# Patient Record
Sex: Male | Born: 1950
Health system: Southern US, Community
[De-identification: ages and names within clinical notes are randomized; demographics above are authoritative.]

## PROBLEM LIST (undated history)

## (undated) DIAGNOSIS — Z8669 Personal history of other diseases of the nervous system and sense organs: Secondary | ICD-10-CM

## (undated) DIAGNOSIS — E65 Localized adiposity: Secondary | ICD-10-CM

## (undated) DIAGNOSIS — M545 Low back pain, unspecified: Secondary | ICD-10-CM

## (undated) DIAGNOSIS — K219 Gastro-esophageal reflux disease without esophagitis: Secondary | ICD-10-CM

## (undated) DIAGNOSIS — M1990 Unspecified osteoarthritis, unspecified site: Secondary | ICD-10-CM

## (undated) DIAGNOSIS — Z9889 Other specified postprocedural states: Secondary | ICD-10-CM

## (undated) DIAGNOSIS — J189 Pneumonia, unspecified organism: Secondary | ICD-10-CM

## (undated) DIAGNOSIS — G4733 Obstructive sleep apnea (adult) (pediatric): Secondary | ICD-10-CM

## (undated) DIAGNOSIS — I10 Essential (primary) hypertension: Secondary | ICD-10-CM

## (undated) DIAGNOSIS — R112 Nausea with vomiting, unspecified: Secondary | ICD-10-CM

## (undated) DIAGNOSIS — Z8719 Personal history of other diseases of the digestive system: Secondary | ICD-10-CM

## (undated) HISTORY — PX: CARPAL TUNNEL RELEASE: SHX101

## (undated) HISTORY — DX: Essential (primary) hypertension: I10

## (undated) HISTORY — PX: APPENDECTOMY: SHX54

---
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
## 2005-01-12 ENCOUNTER — Ambulatory Visit (HOSPITAL_COMMUNITY): Admission: RE | Admit: 2005-01-12 | Discharge: 2005-01-12 | Payer: Self-pay | Admitting: Gastroenterology

## 2008-04-04 ENCOUNTER — Emergency Department (HOSPITAL_COMMUNITY): Admission: EM | Admit: 2008-04-04 | Discharge: 2008-04-04 | Payer: Self-pay | Admitting: Family Medicine

## 2009-06-27 ENCOUNTER — Emergency Department (HOSPITAL_COMMUNITY): Admission: EM | Admit: 2009-06-27 | Discharge: 2009-06-27 | Payer: Self-pay | Admitting: Emergency Medicine

## 2010-07-03 NOTE — Op Note (Signed)
NAME:  Joshua Cochran, TONY NO.:  [id number]   MEDICAL RECORD NO.:  [id number]          PATIENT TYPE:  AMB   LOCATION:  ENDO                         FACILITY:  [facility]   PHYSICIAN:  Graylin Shiver, M.D.   DATE OF BIRTH:  [birth year]   DATE OF PROCEDURE:  01/12/2005  DATE OF DISCHARGE:                                 OPERATIVE REPORT   PROCEDURE:  Colonoscopy.   INDICATIONS FOR PROCEDURE:  Heme-positive stool.   Informed consent was obtained after explanation of the risks of bleeding,  infection and perforation.   PREMEDICATION:  Fentanyl 75 mcg IV, Versed 7 mg IV.   DESCRIPTION OF PROCEDURE:  With the patient in the left lateral decubitus  position, a rectal exam was performed, no masses were felt. The Olympus  colonoscope was inserted into the rectum and advanced around the colon to  the cecum. Cecal landmarks were identified. The cecum and ascending colon  were normal. The transverse colon was normal. The descending colon, sigmoid  and rectum normal. The scope was retroflexed in the rectum. This looked  normal. The scope was straightened and brought out. He tolerated the  procedure well without complications.   IMPRESSION:  Normal colonoscopy to the cecum.   I would recommend a follow-up screening colonoscopy again in 10 years.           ______________________________  Graylin Shiver, M.D.     SFG/MEDQ  D:  01/12/2005  T:  01/13/2005  Job:  [id number]   cc:   L. Lupe Carney, M.D.  Fax: [phone]

## 2015-03-10 DIAGNOSIS — E291 Testicular hypofunction: Secondary | ICD-10-CM | POA: Diagnosis not present

## 2015-03-10 DIAGNOSIS — Z6841 Body Mass Index (BMI) 40.0 and over, adult: Secondary | ICD-10-CM | POA: Diagnosis not present

## 2015-03-10 DIAGNOSIS — R0683 Snoring: Secondary | ICD-10-CM | POA: Diagnosis not present

## 2015-03-10 DIAGNOSIS — E559 Vitamin D deficiency, unspecified: Secondary | ICD-10-CM | POA: Diagnosis not present

## 2015-03-31 DIAGNOSIS — G4733 Obstructive sleep apnea (adult) (pediatric): Secondary | ICD-10-CM | POA: Diagnosis not present

## 2015-04-21 DIAGNOSIS — Z6841 Body Mass Index (BMI) 40.0 and over, adult: Secondary | ICD-10-CM | POA: Diagnosis not present

## 2015-04-21 DIAGNOSIS — G4733 Obstructive sleep apnea (adult) (pediatric): Secondary | ICD-10-CM | POA: Diagnosis not present

## 2015-04-21 DIAGNOSIS — Z79899 Other long term (current) drug therapy: Secondary | ICD-10-CM | POA: Diagnosis not present

## 2015-04-21 DIAGNOSIS — E6609 Other obesity due to excess calories: Secondary | ICD-10-CM | POA: Diagnosis not present

## 2015-10-13 DIAGNOSIS — I10 Essential (primary) hypertension: Secondary | ICD-10-CM | POA: Diagnosis not present

## 2015-10-13 DIAGNOSIS — Z6841 Body Mass Index (BMI) 40.0 and over, adult: Secondary | ICD-10-CM | POA: Diagnosis not present

## 2015-10-13 DIAGNOSIS — G4733 Obstructive sleep apnea (adult) (pediatric): Secondary | ICD-10-CM | POA: Diagnosis not present

## 2015-10-13 DIAGNOSIS — E6609 Other obesity due to excess calories: Secondary | ICD-10-CM | POA: Diagnosis not present

## 2015-10-13 MED FILL — ANUCORT-HC 25 MG SUPP: 25 | 30 days supply | Qty: 20 | Fill #0

## 2015-10-13 MED FILL — HYDROCHLOROTHIAZIDE 25 MG T: 25 | 90 days supply | Qty: 90 | Fill #0

## 2016-02-16 DIAGNOSIS — Z8719 Personal history of other diseases of the digestive system: Secondary | ICD-10-CM

## 2016-02-16 HISTORY — DX: Personal history of other diseases of the digestive system: Z87.19

## 2016-04-05 DIAGNOSIS — G4733 Obstructive sleep apnea (adult) (pediatric): Secondary | ICD-10-CM | POA: Diagnosis not present

## 2016-04-05 DIAGNOSIS — K219 Gastro-esophageal reflux disease without esophagitis: Secondary | ICD-10-CM | POA: Diagnosis not present

## 2016-04-05 DIAGNOSIS — K210 Gastro-esophageal reflux disease with esophagitis: Secondary | ICD-10-CM | POA: Diagnosis not present

## 2016-04-05 DIAGNOSIS — I10 Essential (primary) hypertension: Secondary | ICD-10-CM | POA: Diagnosis not present

## 2016-04-05 DIAGNOSIS — E6609 Other obesity due to excess calories: Secondary | ICD-10-CM | POA: Diagnosis not present

## 2016-04-05 DIAGNOSIS — Z6841 Body Mass Index (BMI) 40.0 and over, adult: Secondary | ICD-10-CM | POA: Diagnosis not present

## 2016-04-05 MED FILL — raNITIdine HCL 300 MG TABS: 300 | 90 days supply | Qty: 90 | Fill #0

## 2016-04-05 MED FILL — BENAZEPRIL HCL 20 MG TABLET: 20 | 90 days supply | Qty: 90 | Fill #0

## 2016-04-12 ENCOUNTER — Encounter (INDEPENDENT_AMBULATORY_CARE_PROVIDER_SITE_OTHER): Payer: Self-pay | Admitting: Internal Medicine

## 2016-04-12 ENCOUNTER — Encounter (INDEPENDENT_AMBULATORY_CARE_PROVIDER_SITE_OTHER): Payer: Self-pay

## 2016-04-22 ENCOUNTER — Ambulatory Visit (INDEPENDENT_AMBULATORY_CARE_PROVIDER_SITE_OTHER): Payer: Self-pay | Admitting: Internal Medicine

## 2016-05-10 ENCOUNTER — Ambulatory Visit (INDEPENDENT_AMBULATORY_CARE_PROVIDER_SITE_OTHER): Payer: 59 | Admitting: Internal Medicine

## 2016-05-10 ENCOUNTER — Other Ambulatory Visit (INDEPENDENT_AMBULATORY_CARE_PROVIDER_SITE_OTHER): Payer: Self-pay | Admitting: Internal Medicine

## 2016-05-10 ENCOUNTER — Encounter (INDEPENDENT_AMBULATORY_CARE_PROVIDER_SITE_OTHER): Payer: Self-pay | Admitting: *Deleted

## 2016-05-10 ENCOUNTER — Encounter (INDEPENDENT_AMBULATORY_CARE_PROVIDER_SITE_OTHER): Payer: Self-pay | Admitting: Internal Medicine

## 2016-05-10 VITALS — BP 130/72 | HR 64 | Temp 97.7°F | Ht 74.0 in | Wt 306.8 lb

## 2016-05-10 DIAGNOSIS — K219 Gastro-esophageal reflux disease without esophagitis: Secondary | ICD-10-CM | POA: Diagnosis not present

## 2016-05-10 DIAGNOSIS — I10 Essential (primary) hypertension: Secondary | ICD-10-CM

## 2016-05-10 HISTORY — DX: Essential (primary) hypertension: I10

## 2016-05-10 MED ORDER — PANTOPRAZOLE SODIUM 40 MG PO TBEC: 40.0000 mg | DELAYED_RELEASE_TABLET | Freq: Two times a day (BID) | ORAL | 5 refills | Status: DC

## 2016-05-10 MED FILL — PANTOPRAZOLE SOD DR 40 MG T: 40 | 30 days supply | Qty: 60 | Fill #0

## 2016-05-10 NOTE — Progress Notes (Signed)
   Subjective:    Patient ID: Joshua Cochran, male    DOB: 03/27/1950, 66 y.o.   MRN: [id number]  HPI Referred by Dr. Olena LeatherwoodHasanaj for GERD. He states he has acid reflux x 6-7 yrs. He has taken oc TUMs in the past. Some days are worse than others. He was started on Zantac about 6 weeks ago which has helped.  He says he 50% better. 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 Brett Albinoizza causes him problems.  He tries to watch what he eats.  He eats plenty of fruits.    Sometimes he has a bad taste in his mouth in the morning. He wears a C-Pap. Appetite is good. No weight loss. He has a BM daily. [place] colonoscopy 1 yrs ago in [place] and he reports it was normal.  Patient would like to proceed with an EGD Wife is a Engineer, civil (consulting)nurse.    Review of Systems Past Medical History:  Diagnosis Date  . Essential hypertension, benign 05/10/2016    No past surgical history on file.  No Known Allergies  No current outpatient prescriptions on file prior to visit.   No current facility-administered medications on file prior to visit.    Current Outpatient Prescriptions  Medication Sig Dispense Refill  . benazepril (LOTENSIN) 20 MG tablet Take 20 mg by mouth daily.    . Omega-3 Fatty Acids (FISH OIL) 1000 MG CAPS Take 3,000 mg by mouth.    . ranitidine (ZANTAC) 300 MG tablet Take 300 mg by mouth at bedtime.    . cholecalciferol (VITAMIN D) 1000 units tablet Take 1,000 Units by mouth daily.     No current facility-administered medications for this visit.        Objective:   Physical Exam Blood pressure 130/72, pulse 64, temperature 97.7 F (36.5 C), height 6\' 2"  (1.88 m), weight (!) 306 lb 12.8 oz (139.2 kg).  Alert and oriented. Skin warm and dry. Oral mucosa is moist.   . Sclera anicteric, conjunctivae is pink. Thyroid not enlarged. No cervical lymphadenopathy. Lungs clear. Heart regular rate and rhythm.  Abdomen is soft. Bowel sounds are positive. No hepatomegaly. No abdominal masses felt.  No tenderness.  No edema to lower extremities.         Assessment & Plan:  GERD.  Am going to switch him to Protonix twice a day. Needs an EGD to r/u PUD. GERD diet given to patient.

## 2016-05-10 NOTE — Patient Instructions (Signed)
EGD. The risks and benefits such as perforation, bleeding, and infection were reviewed with the patient and is agreeable. 

## 2016-07-06 ENCOUNTER — Other Ambulatory Visit (INDEPENDENT_AMBULATORY_CARE_PROVIDER_SITE_OTHER): Payer: Self-pay | Admitting: Internal Medicine

## 2016-07-06 DIAGNOSIS — K219 Gastro-esophageal reflux disease without esophagitis: Secondary | ICD-10-CM

## 2016-07-08 ENCOUNTER — Ambulatory Visit (HOSPITAL_COMMUNITY)
Admission: RE | Admit: 2016-07-08 | Discharge: 2016-07-08 | Disposition: A | Discharge status: Home / Self Care | Payer: 59 | Source: Ambulatory Visit | Attending: Internal Medicine | Admitting: Internal Medicine

## 2016-07-08 ENCOUNTER — Encounter (HOSPITAL_COMMUNITY)
Admission: RE | Disposition: A | Discharge status: Home / Self Care | Payer: Self-pay | Source: Ambulatory Visit | Attending: Internal Medicine

## 2016-07-08 ENCOUNTER — Encounter (HOSPITAL_COMMUNITY): Payer: Self-pay

## 2016-07-08 DIAGNOSIS — K228 Other specified diseases of esophagus: Secondary | ICD-10-CM

## 2016-07-08 DIAGNOSIS — K449 Diaphragmatic hernia without obstruction or gangrene: Secondary | ICD-10-CM | POA: Insufficient documentation

## 2016-07-08 DIAGNOSIS — K219 Gastro-esophageal reflux disease without esophagitis: Secondary | ICD-10-CM | POA: Diagnosis not present

## 2016-07-08 DIAGNOSIS — Z6839 Body mass index (BMI) 39.0-39.9, adult: Secondary | ICD-10-CM | POA: Diagnosis not present

## 2016-07-08 DIAGNOSIS — E669 Obesity, unspecified: Secondary | ICD-10-CM | POA: Insufficient documentation

## 2016-07-08 DIAGNOSIS — I10 Essential (primary) hypertension: Secondary | ICD-10-CM | POA: Insufficient documentation

## 2016-07-08 DIAGNOSIS — K210 Gastro-esophageal reflux disease with esophagitis: Secondary | ICD-10-CM

## 2016-07-08 DIAGNOSIS — Z79899 Other long term (current) drug therapy: Secondary | ICD-10-CM | POA: Insufficient documentation

## 2016-07-08 HISTORY — PX: ESOPHAGOGASTRODUODENOSCOPY: SHX5428

## 2016-07-08 SURGERY — EGD (ESOPHAGOGASTRODUODENOSCOPY)
Anesthesia: Moderate Sedation

## 2016-07-08 MED ORDER — SODIUM CHLORIDE 0.9 % IV SOLN
INTRAVENOUS | Status: DC
  Administered 2016-07-08: 12:00:00 via INTRAVENOUS

## 2016-07-08 MED ORDER — MIDAZOLAM HCL 5 MG/5ML IJ SOLN
INTRAMUSCULAR | Status: AC
  Filled 2016-07-08: qty 10

## 2016-07-08 MED ORDER — MEPERIDINE HCL 50 MG/ML IJ SOLN
INTRAMUSCULAR | Status: DC | PRN
  Administered 2016-07-08 (×2): 25 mg via INTRAVENOUS

## 2016-07-08 MED ORDER — STERILE WATER FOR IRRIGATION IR SOLN
Status: DC | PRN
  Administered 2016-07-08: 2.5 mL

## 2016-07-08 MED ORDER — MIDAZOLAM HCL 5 MG/5ML IJ SOLN
INTRAMUSCULAR | Status: DC | PRN
  Administered 2016-07-08 (×3): 2 mg via INTRAVENOUS

## 2016-07-08 MED ORDER — LIDOCAINE VISCOUS 2 % MT SOLN
OROMUCOSAL | Status: AC
  Filled 2016-07-08: qty 15

## 2016-07-08 MED ORDER — MEPERIDINE HCL 50 MG/ML IJ SOLN
INTRAMUSCULAR | Status: AC
  Filled 2016-07-08: qty 1

## 2016-07-08 NOTE — H&P (Signed)
Joshua Cochran is an 66 y.o. male.   Chief Complaint: Patient is here for EGD. HPI: Patient is 66 year old Caucasian male who has symptoms of GERD for more than 6 years. He did not do well with OTC medications. He is doing much better with double dose PPI. He is here for diagnostic EGD. He denies nausea vomiting. He is not having dysphagia anymore. He also denies abdominal pain or melena. Patient does not smoke cigarettes or drink alcohol. 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  Past Medical History:  Diagnosis Date  . Essential hypertension, benign 05/10/2016        Sleep apnea       Obesity.       Chronic GERD.  Past Surgical History:  Procedure Laterality Date  . APPENDECTOMY     age 89, ruptured  . CARPAL TUNNEL RELEASE     both wrist in 1994    History reviewed. No pertinent family history. Social History:  reports that he has never smoked. He has never used smokeless tobacco. He reports that he uses drugs, including Methylphenidate. He reports that he does not drink alcohol.  Allergies: No Known Allergies  Medications Prior to Admission  Medication Sig Dispense Refill  . benazepril (LOTENSIN) 20 MG tablet Take 20 mg by mouth daily.    . cholecalciferol (VITAMIN D) 1000 units tablet Take 1,000 Units by mouth daily.    Marland Kitchen. ibuprofen (ADVIL,MOTRIN) 200 MG tablet Take 400 mg by mouth every 8 (eight) hours as needed (for pain.).    Marland Kitchen. naproxen sodium (ANAPROX) 220 MG tablet Take 440 mg by mouth 2 (two) times daily as needed (for pain).    . Omega-3 Fatty Acids (FISH OIL) 1000 MG CAPS Take 3,000 mg by mouth daily at 12 noon.     . pantoprazole (PROTONIX) 40 MG tablet Take 1 tablet (40 mg total) by mouth 2 (two) times daily before a meal. 60 tablet 5    No results found for this or any previous visit (from the past 48 hour(s)). No results found.  ROS  Blood pressure (!) 158/72, pulse 81, temperature 98.1 F (36.7 C), temperature source Oral,  resp. rate 13, height 6' (1.829 m), weight 290 lb (131.5 kg), SpO2 100 %. Physical Exam  Constitutional:  Obese Caucasian male in NAD.  HENT:  Mouth/Throat: Oropharynx is clear and moist.  Eyes: Conjunctivae are normal. No scleral icterus.  Neck: No thyromegaly present.  Cardiovascular: Normal rate, regular rhythm and normal heart sounds.   No murmur heard. Respiratory: Effort normal and breath sounds normal.  GI:  Abdomen is obese. Right lower paramedian scar. Abdomen is soft and nontender without organomegaly or masses.  Musculoskeletal: He exhibits no edema.  Lymphadenopathy:    He has no cervical adenopathy.  Neurological: He is alert.  Skin: Skin is warm and dry.     Assessment/Plan Chronic GERD. Diagnostic EGD.  Lionel DecemberNajeeb Rehman, MD 07/08/2016, 1:35 PM

## 2016-07-08 NOTE — Op Note (Signed)
[facility] Patient Name: Joshua Cochran Procedure Date: 07/08/2016 12:27 PM MRN: [id number] Date of Birth: [birth year] Attending MD: Lionel December , MD CSN: [id number] Age: 66 Admit Type: Outpatient Procedure:                Upper GI endoscopy Indications:              Follow-up of gastro-esophageal reflux disease Providers:                Lionel December, MD, Nena Polio, RN, Edrick Kins, RN Referring MD:             Lia Hopping, MD Medicines:                Lidocaine spray, Meperidine 50 mg IV, Midazolam 6                            mg IV Complications:            No immediate complications. Estimated Blood Loss:     Estimated blood loss: none. Procedure:                Pre-Anesthesia Assessment:                           - Prior to the procedure, a History and Physical                            was performed, and patient medications and                            allergies were reviewed. The patient's tolerance of                            previous anesthesia was also reviewed. The risks                            and benefits of the procedure and the sedation                            options and risks were discussed with the patient.                            All questions were answered, and informed consent                            was obtained. Prior Anticoagulants: The patient                            last took ibuprofen 7 days and naproxen 7 days                            prior to the procedure. ASA Grade Assessment: II -                            A patient with mild systemic disease. After  reviewing the risks and benefits, the patient was                            deemed in satisfactory condition to undergo the                            procedure.                           After obtaining informed consent, the endoscope was                            passed under direct vision. Throughout the                            procedure,  the patient's blood pressure, pulse, and                            oxygen saturations were monitored continuously. The                            [id number]) was introduced through the mouth,                            and advanced to the second part of duodenum. The                            upper GI endoscopy was accomplished without                            difficulty. The patient tolerated the procedure                            well. Scope In: 1:46:43 PM Scope Out: 1:54:09 PM Total Procedure Duration: 0 hours 7 minutes 26 seconds  Findings:      The proximal esophagus and mid esophagus were normal.      LA Grade A (one or more mucosal breaks less than 5 mm, not extending       between tops of 2 mucosal folds) esophagitis was found 40 to 41 cm from       the incisors.      The Z-line was irregular and was found 41 cm from the incisors.      A 3 cm hiatal hernia was present.      The entire examined stomach was normal.      The duodenal bulb and second portion of the duodenum were normal. Impression:               - Normal proximal esophagus and mid esophagus.                           - LA Grade A reflux esophagitis.                           - Z-line irregular, 41 cm from the incisors.                           -  3 cm hiatal hernia.                           - Normal stomach.                           - Normal duodenal bulb and second portion of the                            duodenum.                           - No specimens collected. Moderate Sedation:      Moderate (conscious) sedation was administered by the endoscopy nurse       and supervised by the endoscopist. The following parameters were       monitored: oxygen saturation, heart rate, blood pressure, CO2       capnography and response to care. Total physician intraservice time was       15 minutes. Recommendation:           - Patient has a contact number available for                             emergencies. The signs and symptoms of potential                            delayed complications were discussed with the                            patient. Return to normal activities tomorrow.                            Written discharge instructions were provided to the                            patient.                           - Resume previous diet today.                           - Continue present medications.                           - Decrease pantoprazole to 40 mg by mouth every                            morning after 2 months.                           - Return to GI clinic in 1 year. Procedure Code(s):        --- Professional ---                           [phone], Esophagogastroduodenoscopy, flexible,  transoral; diagnostic, including collection of                            specimen(s) by brushing or washing, when performed                            (separate procedure)                           99152, Moderate sedation services provided by the                            same physician or other qualified health care                            professional performing the diagnostic or                            therapeutic service that the sedation supports,                            requiring the presence of an independent trained                            observer to assist in the monitoring of the                            patient's level of consciousness and physiological                            status; initial 15 minutes of intraservice time,                            patient age 30 years or older Diagnosis Code(s):        --- Professional ---                           K21.0, Gastro-esophageal reflux disease with                            esophagitis                           K22.8, Other specified diseases of esophagus                           K44.9, Diaphragmatic hernia without obstruction or                             gangrene CPT copyright 2016 American Medical Association. All rights reserved. The codes documented in this report are preliminary and upon coder review may  be revised to meet current compliance requirements. Lionel December, MD Lionel December, MD 07/08/2016 2:10:54 PM This report has been signed electronically. Number of Addenda: 0

## 2016-07-08 NOTE — Discharge Instructions (Signed)
Do not take ibuprofen continue take naproxen or Aleve. Resume other medications as before. Continue pantoprazole 40 mg twice daily for another 2 months and thereafter once daily. Resume usual diet. No driving for 24 hours. Office visit in one year.

## 2016-07-16 ENCOUNTER — Encounter (HOSPITAL_COMMUNITY): Payer: Self-pay | Admitting: Internal Medicine

## 2016-09-20 DIAGNOSIS — K6289 Other specified diseases of anus and rectum: Secondary | ICD-10-CM | POA: Diagnosis not present

## 2016-10-22 DIAGNOSIS — G8929 Other chronic pain: Secondary | ICD-10-CM | POA: Diagnosis not present

## 2016-10-22 DIAGNOSIS — G4733 Obstructive sleep apnea (adult) (pediatric): Secondary | ICD-10-CM | POA: Diagnosis not present

## 2016-10-22 DIAGNOSIS — K210 Gastro-esophageal reflux disease with esophagitis: Secondary | ICD-10-CM | POA: Diagnosis not present

## 2016-10-22 DIAGNOSIS — I10 Essential (primary) hypertension: Secondary | ICD-10-CM | POA: Diagnosis not present

## 2016-10-22 DIAGNOSIS — E6609 Other obesity due to excess calories: Secondary | ICD-10-CM | POA: Diagnosis not present

## 2016-10-22 DIAGNOSIS — M545 Low back pain: Secondary | ICD-10-CM | POA: Diagnosis not present

## 2016-10-22 DIAGNOSIS — Z6841 Body Mass Index (BMI) 40.0 and over, adult: Secondary | ICD-10-CM | POA: Diagnosis not present

## 2016-10-22 MED FILL — BENAZEPRIL HCL 20 MG TABLET: 20 | 90 days supply | Qty: 90 | Fill #0

## 2016-12-04 DIAGNOSIS — G8929 Other chronic pain: Secondary | ICD-10-CM | POA: Diagnosis not present

## 2016-12-04 DIAGNOSIS — M545 Low back pain: Secondary | ICD-10-CM | POA: Diagnosis not present

## 2017-01-08 DIAGNOSIS — H524 Presbyopia: Secondary | ICD-10-CM | POA: Diagnosis not present

## 2017-01-10 DIAGNOSIS — G8929 Other chronic pain: Secondary | ICD-10-CM | POA: Diagnosis not present

## 2017-01-10 DIAGNOSIS — M545 Low back pain: Secondary | ICD-10-CM | POA: Diagnosis not present

## 2017-01-10 MED FILL — METHYLPREDNISOLONE 4 MG TAB: 4 | 6 days supply | Qty: 21 | Fill #0

## 2017-01-11 DIAGNOSIS — E65 Localized adiposity: Secondary | ICD-10-CM | POA: Insufficient documentation

## 2017-05-09 DIAGNOSIS — E6609 Other obesity due to excess calories: Secondary | ICD-10-CM | POA: Diagnosis not present

## 2017-05-09 DIAGNOSIS — K210 Gastro-esophageal reflux disease with esophagitis: Secondary | ICD-10-CM | POA: Diagnosis not present

## 2017-05-09 DIAGNOSIS — Z6841 Body Mass Index (BMI) 40.0 and over, adult: Secondary | ICD-10-CM | POA: Diagnosis not present

## 2017-05-09 DIAGNOSIS — N182 Chronic kidney disease, stage 2 (mild): Secondary | ICD-10-CM | POA: Diagnosis not present

## 2017-05-09 DIAGNOSIS — E7849 Other hyperlipidemia: Secondary | ICD-10-CM | POA: Diagnosis not present

## 2017-05-09 DIAGNOSIS — Z125 Encounter for screening for malignant neoplasm of prostate: Secondary | ICD-10-CM | POA: Diagnosis not present

## 2017-05-09 DIAGNOSIS — I10 Essential (primary) hypertension: Secondary | ICD-10-CM | POA: Diagnosis not present

## 2017-05-09 DIAGNOSIS — G4733 Obstructive sleep apnea (adult) (pediatric): Secondary | ICD-10-CM | POA: Diagnosis not present

## 2017-05-09 MED FILL — ATORVASTATIN 10 MG TABLET: 10 | 90 days supply | Qty: 90 | Fill #0

## 2017-07-12 ENCOUNTER — Ambulatory Visit (INDEPENDENT_AMBULATORY_CARE_PROVIDER_SITE_OTHER): Payer: 59 | Admitting: Internal Medicine

## 2017-07-12 MED FILL — BENAZEPRIL HCL 20 MG TABLET: 20 | 90 days supply | Qty: 90 | Fill #0

## 2017-08-29 DIAGNOSIS — Z6841 Body Mass Index (BMI) 40.0 and over, adult: Secondary | ICD-10-CM | POA: Diagnosis not present

## 2017-08-29 DIAGNOSIS — E6609 Other obesity due to excess calories: Secondary | ICD-10-CM | POA: Diagnosis not present

## 2017-08-29 DIAGNOSIS — K210 Gastro-esophageal reflux disease with esophagitis: Secondary | ICD-10-CM | POA: Diagnosis not present

## 2017-08-29 DIAGNOSIS — E7849 Other hyperlipidemia: Secondary | ICD-10-CM | POA: Diagnosis not present

## 2017-08-29 DIAGNOSIS — I10 Essential (primary) hypertension: Secondary | ICD-10-CM | POA: Diagnosis not present

## 2017-08-29 DIAGNOSIS — G4733 Obstructive sleep apnea (adult) (pediatric): Secondary | ICD-10-CM | POA: Diagnosis not present

## 2017-08-29 MED FILL — ATORVASTATIN 10 MG TABLET: 10 | 90 days supply | Qty: 90 | Fill #0

## 2017-10-26 DIAGNOSIS — G4730 Sleep apnea, unspecified: Secondary | ICD-10-CM | POA: Diagnosis not present

## 2017-10-26 DIAGNOSIS — I10 Essential (primary) hypertension: Secondary | ICD-10-CM | POA: Diagnosis not present

## 2017-10-26 DIAGNOSIS — K219 Gastro-esophageal reflux disease without esophagitis: Secondary | ICD-10-CM | POA: Diagnosis not present

## 2017-11-22 ENCOUNTER — Other Ambulatory Visit (HOSPITAL_COMMUNITY): Payer: Self-pay | Admitting: General Surgery

## 2017-12-05 DIAGNOSIS — E6609 Other obesity due to excess calories: Secondary | ICD-10-CM | POA: Diagnosis not present

## 2017-12-05 DIAGNOSIS — N183 Chronic kidney disease, stage 3 (moderate): Secondary | ICD-10-CM | POA: Diagnosis not present

## 2017-12-05 DIAGNOSIS — Z6841 Body Mass Index (BMI) 40.0 and over, adult: Secondary | ICD-10-CM | POA: Diagnosis not present

## 2017-12-05 DIAGNOSIS — K210 Gastro-esophageal reflux disease with esophagitis: Secondary | ICD-10-CM | POA: Diagnosis not present

## 2017-12-05 DIAGNOSIS — I10 Essential (primary) hypertension: Secondary | ICD-10-CM | POA: Diagnosis not present

## 2017-12-05 DIAGNOSIS — G4733 Obstructive sleep apnea (adult) (pediatric): Secondary | ICD-10-CM | POA: Diagnosis not present

## 2017-12-05 DIAGNOSIS — E7849 Other hyperlipidemia: Secondary | ICD-10-CM | POA: Diagnosis not present

## 2017-12-05 MED FILL — BENAZEPRIL HCL 20 MG TABLET: 20 | 90 days supply | Qty: 90 | Fill #0

## 2017-12-06 ENCOUNTER — Encounter: Payer: 59 | Attending: General Surgery | Admitting: Skilled Nursing Facility1

## 2017-12-06 ENCOUNTER — Encounter: Payer: Self-pay | Admitting: Skilled Nursing Facility1

## 2017-12-06 DIAGNOSIS — Z6841 Body Mass Index (BMI) 40.0 and over, adult: Secondary | ICD-10-CM | POA: Diagnosis not present

## 2017-12-06 DIAGNOSIS — G4730 Sleep apnea, unspecified: Secondary | ICD-10-CM | POA: Insufficient documentation

## 2017-12-06 DIAGNOSIS — Z713 Dietary counseling and surveillance: Secondary | ICD-10-CM | POA: Insufficient documentation

## 2017-12-06 DIAGNOSIS — I10 Essential (primary) hypertension: Secondary | ICD-10-CM | POA: Insufficient documentation

## 2017-12-06 DIAGNOSIS — E669 Obesity, unspecified: Secondary | ICD-10-CM | POA: Insufficient documentation

## 2017-12-06 DIAGNOSIS — K219 Gastro-esophageal reflux disease without esophagitis: Secondary | ICD-10-CM | POA: Diagnosis not present

## 2017-12-06 MED FILL — traMADol HCL 50 MG TABS: 50 | 30 days supply | Qty: 60 | Fill #0

## 2017-12-06 NOTE — Progress Notes (Signed)
Pre-Op Assessment Visit:  Pre-Operative RYGB Surgery  Medical Nutrition Therapy:  Appt start time: 8:00  End time:  9:15  Patient was seen on 12/06/2017 for Pre-Operative Nutrition Assessment. Assessment and letter of approval faxed to [facility] Bariatric Surgery Program coordinator on 12/06/2017.   Pt struggles with listening and taking away different messages from what was taught. Pt states he does not like the way he looks, "too big". Pt state she has a bad back which feels best when he sits. 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 Pt states if he eats sugar past 12pm he cannot sleep at night.  Pt states he is constantly in pain and needs strong pain medicines sometimes (strnoger than tylenol).  Pt is talkative. Pt continued to repeat his plite of "I cannot lose weight and I have tried all diets and I still cannot lose weight"   Pt expectation of surgery: to control blood pressure and lose weight because he is not happy with how he looks  Pt expectation of Dietitian: none  Start weight at [facility]: 310.5 BMI: 44.1   24 hr Dietary Recall: Mostly snacking throughout the day First Meal: cereal or eggs  Snack: railmix Second Meal: hamburger or fish Snack: hard candy and fruit Third Meal: sandwich or frozen meals Snack: fruit Beverages: 2 % milk, chocolate milk with coffee, water with caffeine, grapefurit juice  Encouraged to engage in 150 minutes of moderate physical activity including cardiovascular and weight baring weekly  Handouts given during visit include:  . Pre-Op Goals . Bariatric Surgery Protein Shakes During the appointment today the following Pre-Op Goals were reviewed with the patient: . Maintain or lose weight as instructed by your surgeon . Make healthy food choices . Begin to limit portion sizes . Limited concentrated sugars and fried foods . Keep fat/sugar in the single digits per serving on              food labels . Practice CHEWING your food  (aim for 30 chews per bite or until applesauce consistency) . Practice not drinking 15 minutes before, during, and 30 minutes after each meal/snack . Avoid all carbonated beverages  . Avoid/limit caffeinated beverages: decaf coffee  . Avoid all sugar-sweetened beverages . Consume 3 meals per day; eat every 3-5 hours . Make a list of non-food related activities . Aim for 64-100 ounces of FLUID daily  . Aim for at least 60-80 grams of PROTEIN daily . Look for a liquid protein source that contain ?15 g protein and ?5 g carbohydrate  (ex: shakes, drinks, shots) . 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

## 2018-01-05 ENCOUNTER — Encounter: Payer: 59 | Attending: General Surgery | Admitting: Skilled Nursing Facility1

## 2018-01-05 ENCOUNTER — Encounter: Payer: Self-pay | Admitting: Skilled Nursing Facility1

## 2018-01-05 DIAGNOSIS — Z6841 Body Mass Index (BMI) 40.0 and over, adult: Secondary | ICD-10-CM | POA: Insufficient documentation

## 2018-01-05 DIAGNOSIS — E669 Obesity, unspecified: Secondary | ICD-10-CM | POA: Diagnosis not present

## 2018-01-05 DIAGNOSIS — G4730 Sleep apnea, unspecified: Secondary | ICD-10-CM | POA: Diagnosis not present

## 2018-01-05 DIAGNOSIS — K219 Gastro-esophageal reflux disease without esophagitis: Secondary | ICD-10-CM | POA: Diagnosis not present

## 2018-01-05 DIAGNOSIS — I10 Essential (primary) hypertension: Secondary | ICD-10-CM | POA: Diagnosis not present

## 2018-01-05 DIAGNOSIS — Z713 Dietary counseling and surveillance: Secondary | ICD-10-CM | POA: Diagnosis not present

## 2018-01-05 NOTE — Progress Notes (Signed)
RYGB Assessment:   1st SWL Appointment.   Pt states he needs 6 months SWL.   Pt states he has been on the road for a month. Pt states he has not had drank any soda and no alcohol. 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 Pt stets his son has been causing him a lot of stress which he also has dealing with it with music and venting to his wife. Pt states he does skip meals sometimes but seldom. Pt states he tends not to eat when he is stressing. Pt states he thinks he needs a counselor for depression and also misses the dairy farm. Pt stets he has 3 friends that were farmers that are very sick. Pt states his dog helps him through the his depression. Pt states he is going to find it difficult to not dink while eating and also limiting what he eats including candy bars. Pt states he has stuck with 2% milk.  Pt was given the crisis line card for moments he needs the extra help.   Start weight at [facility]: 310.5 Wt: 304 BMI: 43.13  MEDICATIONS: see list   Start weight at [facility]: 310.5 BMI: 44.1  24 hr Dietary Recall: Mostly snacking throughout the day First Meal: yogurt  Snack: celery Second Meal: apple or grapes Snack: popcorn Third Meal: hamburger made in the truck with pita with mushroom lettuce and tomato with sweet pickles  Snack: fruit Beverages: 2 % milk, chocolate milk with coffee, water with caffeine, grapefurit juice, apple cider, water with lemon  Usual physical activity: ADL's/walking dog  Follow diet recommendations listed below   Energy and Macronutrient Recomendations: Calories: 1800 Carbohydrate: 200 Protein: 135 Fat: 50   Nutritional Diagnosis:  [place]-3.3 Overweight/obesity related to past poor dietary habits and physical inactivity as evidenced by patient w/ planned RYGB surgery following dietary guidelines for continued weight loss.    Intervention:  Nutrition counseling for upcoming Bariatric  Surgery. Goals: -Encouraged to engage in 150 minutes of moderate physical activity including cardiovascular and weight baring weekly -Have a protein with your fruit for lunch like cheese -Work on not drinking with meals   Teaching Method Utilized:  Visual Auditory Hands on   Barriers to learning/adherence to lifestyle change: none identified   Demonstrated degree of understanding via:  Teach Back   Monitoring/Evaluation:  Dietary intake, exercise, and body weight prn.

## 2018-01-06 ENCOUNTER — Other Ambulatory Visit (HOSPITAL_COMMUNITY): Payer: 59

## 2018-01-06 ENCOUNTER — Ambulatory Visit (HOSPITAL_COMMUNITY): Payer: 59

## 2018-01-06 ENCOUNTER — Encounter (HOSPITAL_COMMUNITY): Payer: Self-pay

## 2018-01-06 MED FILL — ATORVASTATIN 10 MG TABLET: 10 | 90 days supply | Qty: 90 | Fill #0

## 2018-02-06 ENCOUNTER — Ambulatory Visit: Payer: Self-pay | Admitting: Skilled Nursing Facility1

## 2018-03-09 MED FILL — DOXYCYCLINE HYCLATE 100 MG: 100 | 6 days supply | Qty: 12 | Fill #0

## 2018-03-10 ENCOUNTER — Encounter: Payer: Self-pay | Admitting: Dietician

## 2018-03-10 ENCOUNTER — Encounter: Payer: 59 | Attending: General Surgery | Admitting: Dietician

## 2018-03-10 ENCOUNTER — Ambulatory Visit (HOSPITAL_COMMUNITY)
Admission: RE | Admit: 2018-03-10 | Discharge: 2018-03-10 | Disposition: A | Discharge status: Home / Self Care | Payer: 59 | Source: Ambulatory Visit | Attending: General Surgery | Admitting: General Surgery

## 2018-03-10 ENCOUNTER — Other Ambulatory Visit (HOSPITAL_COMMUNITY): Payer: Self-pay | Admitting: General Surgery

## 2018-03-10 ENCOUNTER — Other Ambulatory Visit: Payer: Self-pay

## 2018-03-10 VITALS — Wt 306.0 lb

## 2018-03-10 DIAGNOSIS — K219 Gastro-esophageal reflux disease without esophagitis: Secondary | ICD-10-CM | POA: Insufficient documentation

## 2018-03-10 DIAGNOSIS — I10 Essential (primary) hypertension: Secondary | ICD-10-CM | POA: Insufficient documentation

## 2018-03-10 DIAGNOSIS — Z6841 Body Mass Index (BMI) 40.0 and over, adult: Secondary | ICD-10-CM | POA: Insufficient documentation

## 2018-03-10 DIAGNOSIS — Z01818 Encounter for other preprocedural examination: Secondary | ICD-10-CM | POA: Diagnosis not present

## 2018-03-10 DIAGNOSIS — G4730 Sleep apnea, unspecified: Secondary | ICD-10-CM | POA: Diagnosis not present

## 2018-03-10 DIAGNOSIS — Z713 Dietary counseling and surveillance: Secondary | ICD-10-CM | POA: Diagnosis not present

## 2018-03-10 DIAGNOSIS — E669 Obesity, unspecified: Secondary | ICD-10-CM | POA: Diagnosis not present

## 2018-03-10 NOTE — Progress Notes (Signed)
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 Pt is talkative. Pt states he and his wife have been dealing with depression recently over their youngest son. Pt states he tends not to eat when he is stressing. Pt states he thinks he needs a counselor for depression and also misses working on the dairy farm.  24-Hr Dietary Recall First Meal: fruit + yogurt  Snack: fruit Second Meal: lettuce + tomato + cheese + deli meat + bread Snack: fruit Third Meal: frozen meal (chicken + rice + vegetables) (or mac & cheese, or alfredo)  Snack: none stated  Beverages: Sprite, 2% milk, water   Food & Nutrition Related Hx Dietary Hx: Pt states he purchases his groceries at [employer]. Pt has been sick since Christmas, and states this has suppressed his appetite. Pt states he has drank Sprite a few times to help his upset stomach. 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 Pt states he chews gum and mints to have something in his mouth. Pt states he adds chocolate to his coffee so that it tastes good.   Estimated Daily Fluid Intake: 32 oz coffee + 8 oz milk + 16 oz water GI / Other Notable Symptoms: acid reflux   Physical Activity  Current average weekly physical activity: walking the dog at truck stops  Estimated Energy Needs Calories: 1800 Carbohydrate: 200g Protein: 135g Fat: 50g   NUTRITION DIAGNOSIS  Overweight/obesity (-3.3) related to past poor dietary habits and physical inactivity as evidenced by patient w/ planned RYGB surgery following dietary guidelines for continued  weight loss.   NUTRITION INTERVENTION  Nutrition counseling (C-1) and education (E-2) to facilitate bariatric surgery goals.  Pre-Op Goals Progress & New Goals . Still drinking with meals, and sometimes drinks sodas.  . No alcohol, limiting caffeine intake, and limiting sweetened beverages such as sweet tea.  . No fried foods.  . Going to work on adequate fluid intake, especially increasing water. 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 Provide more high protein snack ideas    MONITORING & EVALUATION Dietary intake, weekly physical activity, body weight, and pre-op goals in 1 month.   Next Steps  Patient is to return to [facility] in 1 month for 3rd SWL visit.  *Patinet states his insurance has changed since the new year and he is unsure of the new SWL requirements. Patient is going to contact [facility] to learn how many more SWL visits he needs to complete. In the meantime, patient plans to return to [facility] for 1 more SWL unless learns otherwise.

## 2018-03-10 NOTE — Patient Instructions (Signed)
Continue working through the [employer]. This month, focus on the following:   Aim to drink 64-100 ounces of fluid every day, with at least half being plain water.

## 2018-04-10 ENCOUNTER — Ambulatory Visit: Payer: Self-pay | Admitting: Skilled Nursing Facility1

## 2018-04-11 ENCOUNTER — Encounter: Payer: 59 | Attending: General Surgery | Admitting: Skilled Nursing Facility1

## 2018-04-11 DIAGNOSIS — Z713 Dietary counseling and surveillance: Secondary | ICD-10-CM | POA: Diagnosis not present

## 2018-04-11 DIAGNOSIS — I10 Essential (primary) hypertension: Secondary | ICD-10-CM | POA: Diagnosis not present

## 2018-04-11 DIAGNOSIS — G4730 Sleep apnea, unspecified: Secondary | ICD-10-CM | POA: Insufficient documentation

## 2018-04-11 DIAGNOSIS — Z6841 Body Mass Index (BMI) 40.0 and over, adult: Secondary | ICD-10-CM | POA: Diagnosis not present

## 2018-04-11 DIAGNOSIS — E669 Obesity, unspecified: Secondary | ICD-10-CM | POA: Diagnosis not present

## 2018-04-11 DIAGNOSIS — K219 Gastro-esophageal reflux disease without esophagitis: Secondary | ICD-10-CM | POA: Diagnosis not present

## 2018-04-11 NOTE — Progress Notes (Signed)
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 Pt is talkative. Pt states he and his wife have been dealing with depression recently over their youngest son. Pt states he tends not to eat when he is stressing. Pt states he thinks he needs a counselor for depression and also misses working on the dairy farm.  Pt states he does not sleep well at home thinking maybe it is because of the mattress.   Pt states he feel she has been successful with getting more active due to his dog as well as feeling better mentallty/emotionally, soda is less often Pt states he thinks he may struggle with eating non starchy vegetables often when in the truck. Pt states he worries about his mobility with back pain.   Pt had specific questions about specific foods which were answered to her satisfaction.   24-Hr Dietary Recall First Meal: fruit + yogurt  Snack: fruit Second Meal: lettuce + tomato + cheese + deli meat + bread Snack: fruit Third Meal: frozen meal (chicken + rice + vegetables) (or mac & cheese, or alfredo)  Snack: none stated  Beverages: Sprite, 2% milk, water   Food & Nutrition Related Hx Dietary Hx: Pt states he purchases his groceries at [employer]. Pt has been sick since Christmas, and states this has suppressed his appetite. Pt states he has drank Sprite a few times to help his upset stomach. 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 Pt states he chews gum and mints to have something in his mouth. Pt states he adds chocolate to his coffee so that it tastes good.   Estimated Daily Fluid  Intake: 32 oz coffee + 8 oz milk + 16 oz water  GI / Other Notable Symptoms: acid reflux   Physical Activity  Current average weekly physical activity: walking the dog at truck stops  Estimated Energy Needs Calories: 1800 Carbohydrate: 200g Protein: 135g Fat: 50g   NUTRITION DIAGNOSIS  Overweight/obesity (-3.3) related to past poor dietary habits and physical inactivity as evidenced by patient w/ planned RYGB surgery following dietary guidelines for continued weight loss.   NUTRITION INTERVENTION  Nutrition counseling (C-1) and education (E-2) to facilitate bariatric surgery goals.  Pre-Op Goals Progress & New Goals . Still drinking with meals, and sometimes drinks sodas.  . No alcohol, limiting caffeine intake, and limiting sweetened beverages such as sweet tea.  . No fried foods.  . Going to work on adequate fluid intake, especially increasing water. (Challenging as a truck driver because it causes more frequent stops for restroom breaks.)   Learning Style & Readiness for Change Teaching method utilized: Visual & Auditory  Demonstrated degree of understanding via: Teach Back  Barriers to learning/adherence to lifestyle change: Depression and stress in other areas of life    MONITORING & EVALUATION Dietary intake, weekly physical activity, body weight, and pre-op goals.   *Patinet states this is his last SWL.

## 2018-04-13 ENCOUNTER — Ambulatory Visit (INDEPENDENT_AMBULATORY_CARE_PROVIDER_SITE_OTHER): Payer: 59 | Admitting: Psychiatry

## 2018-04-13 DIAGNOSIS — F509 Eating disorder, unspecified: Secondary | ICD-10-CM

## 2018-04-25 ENCOUNTER — Ambulatory Visit: Payer: 59 | Admitting: Psychiatry

## 2018-05-05 MED FILL — ATORVASTATIN 10 MG TABLET: 10 | 90 days supply | Qty: 90 | Fill #0

## 2018-05-05 MED FILL — BENAZEPRIL HCL 20 MG TABLET: 20 | 90 days supply | Qty: 90 | Fill #0

## 2018-05-11 ENCOUNTER — Ambulatory Visit: Payer: 59 | Admitting: Psychiatry

## 2018-05-11 DIAGNOSIS — M545 Low back pain: Secondary | ICD-10-CM | POA: Diagnosis not present

## 2018-06-05 DIAGNOSIS — M545 Low back pain: Secondary | ICD-10-CM | POA: Diagnosis not present

## 2018-06-05 DIAGNOSIS — I10 Essential (primary) hypertension: Secondary | ICD-10-CM | POA: Diagnosis not present

## 2018-06-05 DIAGNOSIS — K210 Gastro-esophageal reflux disease with esophagitis: Secondary | ICD-10-CM | POA: Diagnosis not present

## 2018-06-05 DIAGNOSIS — E7849 Other hyperlipidemia: Secondary | ICD-10-CM | POA: Diagnosis not present

## 2018-06-05 DIAGNOSIS — N183 Chronic kidney disease, stage 3 (moderate): Secondary | ICD-10-CM | POA: Diagnosis not present

## 2018-06-05 DIAGNOSIS — R293 Abnormal posture: Secondary | ICD-10-CM | POA: Diagnosis not present

## 2018-06-05 DIAGNOSIS — R262 Difficulty in walking, not elsewhere classified: Secondary | ICD-10-CM | POA: Diagnosis not present

## 2018-06-05 DIAGNOSIS — Z125 Encounter for screening for malignant neoplasm of prostate: Secondary | ICD-10-CM | POA: Diagnosis not present

## 2018-06-05 DIAGNOSIS — Z6841 Body Mass Index (BMI) 40.0 and over, adult: Secondary | ICD-10-CM | POA: Diagnosis not present

## 2018-06-05 DIAGNOSIS — Z0000 Encounter for general adult medical examination without abnormal findings: Secondary | ICD-10-CM | POA: Diagnosis not present

## 2018-06-05 DIAGNOSIS — M6281 Muscle weakness (generalized): Secondary | ICD-10-CM | POA: Diagnosis not present

## 2018-06-05 DIAGNOSIS — G4733 Obstructive sleep apnea (adult) (pediatric): Secondary | ICD-10-CM | POA: Diagnosis not present

## 2018-06-08 ENCOUNTER — Ambulatory Visit: Payer: 59 | Admitting: Psychiatry

## 2018-07-03 DIAGNOSIS — M545 Low back pain: Secondary | ICD-10-CM | POA: Diagnosis not present

## 2018-07-03 DIAGNOSIS — M6281 Muscle weakness (generalized): Secondary | ICD-10-CM | POA: Diagnosis not present

## 2018-07-03 DIAGNOSIS — R293 Abnormal posture: Secondary | ICD-10-CM | POA: Diagnosis not present

## 2018-07-03 DIAGNOSIS — R262 Difficulty in walking, not elsewhere classified: Secondary | ICD-10-CM | POA: Diagnosis not present

## 2018-07-20 ENCOUNTER — Ambulatory Visit: Payer: 59 | Admitting: Psychiatry

## 2018-08-25 MED FILL — ATORVASTATIN 10 MG TABLET: 10 | 90 days supply | Qty: 90 | Fill #0

## 2018-08-25 MED FILL — BENAZEPRIL HCL 20 MG TABLET: 20 | 90 days supply | Qty: 90 | Fill #0

## 2018-09-25 ENCOUNTER — Ambulatory Visit: Payer: 59 | Admitting: Psychology

## 2018-09-26 DIAGNOSIS — I10 Essential (primary) hypertension: Secondary | ICD-10-CM | POA: Diagnosis not present

## 2018-09-26 DIAGNOSIS — K210 Gastro-esophageal reflux disease with esophagitis: Secondary | ICD-10-CM | POA: Diagnosis not present

## 2018-09-26 DIAGNOSIS — M545 Low back pain: Secondary | ICD-10-CM | POA: Diagnosis not present

## 2018-09-26 DIAGNOSIS — Z6841 Body Mass Index (BMI) 40.0 and over, adult: Secondary | ICD-10-CM | POA: Diagnosis not present

## 2018-09-26 DIAGNOSIS — G4730 Sleep apnea, unspecified: Secondary | ICD-10-CM | POA: Diagnosis not present

## 2018-09-26 DIAGNOSIS — Z0000 Encounter for general adult medical examination without abnormal findings: Secondary | ICD-10-CM | POA: Diagnosis not present

## 2018-09-28 ENCOUNTER — Ambulatory Visit (INDEPENDENT_AMBULATORY_CARE_PROVIDER_SITE_OTHER): Payer: 59 | Admitting: Psychiatry

## 2018-09-28 DIAGNOSIS — F509 Eating disorder, unspecified: Secondary | ICD-10-CM

## 2018-10-02 ENCOUNTER — Encounter: Payer: 59 | Attending: General Surgery | Admitting: Skilled Nursing Facility1

## 2018-10-02 ENCOUNTER — Other Ambulatory Visit: Payer: Self-pay

## 2018-10-02 DIAGNOSIS — E669 Obesity, unspecified: Secondary | ICD-10-CM | POA: Insufficient documentation

## 2018-10-02 NOTE — Progress Notes (Signed)
Pre-Operative Nutrition Class:  Appt start time: 5009   End time:  1830.  Patient was seen on 10/02/2018 for Pre-Operative Bariatric Surgery Education at [facility].   Surgery date:  Surgery type: RYGB Start weight at [facility]: 310.5 Weight today: 312.7  The following the learning objectives were met by the patient during this course:  Identify Pre-Op Dietary Goals and will begin 2 weeks pre-operatively  Identify appropriate sources of fluids and proteins   State protein recommendations and appropriate sources pre and post-operatively  Identify Post-Operative Dietary Goals and will follow for 2 weeks post-operatively  Identify appropriate multivitamin and calcium sources  Describe the need for physical activity post-operatively and will follow MD recommendations  State when to call healthcare provider regarding medication questions or post-operative complications  Handouts given during class include:  Pre-Op Bariatric Surgery Diet Handout  Protein Shake Handout  Post-Op Bariatric Surgery Nutrition Handout  [facility] Program Information Flyer  Support Group Information Flyer  [facility] Bariatric Supplements Price List  Follow-Up Plan: Patient will follow-up at [facility] 2 weeks post operatively for diet advancement per MD.

## 2018-10-09 ENCOUNTER — Ambulatory Visit: Payer: 59 | Admitting: Psychology

## 2018-10-19 DIAGNOSIS — E215 Disorder of parathyroid gland, unspecified: Secondary | ICD-10-CM | POA: Diagnosis not present

## 2018-11-08 ENCOUNTER — Ambulatory Visit: Payer: Self-pay | Admitting: General Surgery

## 2018-11-14 ENCOUNTER — Encounter (HOSPITAL_COMMUNITY): Payer: Self-pay

## 2018-11-14 NOTE — Patient Instructions (Addendum)
DUE TO COVID-19 ONLY ONE VISITOR IS ALLOWED TO COME WITH YOU AND STAY IN THE WAITING ROOM ONLY DURING PRE OP AND PROCEDURE. THE ONE VISITOR MAY VISIT WITH YOU IN YOUR PRIVATE ROOM DURING VISITING HOURS ONLY!!   COVID SWAB TESTING COMPLETED ON:  Today at 8:15AM.  (Must self quarantine after testing. Follow instructions on handout.)             Your procedure is scheduled on: Monday, Oct. 5, 2020   Report to [facility] Main  Entrance    Report to admitting at 9:15 AM   Call this number if you have problems the morning of surgery [phone]   Bring CPAP mask and tubing day of surgery   NO SOLID FOOD AFTER 600 PM THE NIGHT BEFORE YOUR SURGERY.    YOU MAY DRINK CLEAR FLUIDS UNTIL 8:15 AM DAY OF SURGERY.   CLEAR LIQUID DIET  Foods Allowed                                                                     Foods Excluded  Water, Black Coffee and tea, regular and decaf                             liquids that you cannot  Plain Jell-O in any flavor  (No red)                                           see through such as: Fruit ices (not with fruit pulp)                                     milk, soups, orange juice  Iced Popsicles (No red)                                    All solid food Carbonated beverages, regular and diet                                    Apple juices Sports drinks like Gatorade (No red) Lightly seasoned clear broth or consume(fat free) Sugar, honey syrup  Sample Menu Breakfast                                Lunch                                     Supper Cranberry juice                    Beef broth                            Chicken broth Jell-O  Grape juice                           Apple juice Coffee or tea                        Jell-O                                      Popsicle                                                Coffee or tea                        Coffee or tea   Complete one Ensure drink the  morning of surgery BEFORE YOU LEAVE HOME, DRINK ALL OF THE  G2 DRINK AT ONE TIME.     Brush your teeth the morning of surgery.   Do NOT smoke after Midnight   Take these medicines the morning of surgery with A SIP OF WATER: Atorvastatin, Pantoprazole                               You may not have any metal on your body including jewelry, and body piercings             Do not wear lotions, powders, perfumes/cologne, or deodorant                           Men may shave face and neck.   Do not bring valuables to the hospital. [place].   Contacts, dentures or bridgework may not be worn into surgery.   Bring small overnight bag day of surgery.    PAIN IS EXPECTED AFTER SURGERY AND WILL NOT BE COMPLETELY ELIMINATED. AMBULATION AND TYLENOL WILL HELP REDUCE INCISIONAL AND GAS PAIN. MOVEMENT IS KEY!   YOU ARE EXPECTED TO BE OUT OF BED WITHIN 4 HOURS OF ADMISSION TO YOUR PATIENT ROOM.   SITTING IN THE RECLINER THROUGHOUT THE DAY IS IMPORTANT FOR DRINKING FLUIDS AND MOVING GAS THROUGHOUT THE GI TRACT.   COMPRESSION STOCKINGS SHOULD BE WORN [place] UNLESS YOU ARE WALKING.    INCENTIVE SPIROMETER SHOULD BE USED EVERY HOUR WHILE AWAKE TO DECREASE POST-OPERATIVE COMPLICATIONS SUCH AS PNEUMONIA.   WHEN DISCHARGED HOME, IT IS IMPORTANT TO CONTINUE TO WALK EVERY HOUR AND USE THE INCENTIVE SPIROMETER EVERY HOUR.    Special Instructions: Bring a copy of your healthcare power of attorney and living will documents         the day of surgery if you haven't scanned them in before.              Please read over the following fact sheets you were given:  [facility] - Preparing for Surgery Before surgery, you can play an important role.  Because skin is not sterile, your skin needs to be as free of germs as possible.  You can reduce the number of germs on  your skin by washing with CHG (chlorahexidine gluconate) soap before surgery.   CHG is an antiseptic cleaner which kills germs and bonds with the skin to continue killing germs even after washing. Please DO NOT use if you have an allergy to CHG or antibacterial soaps.  If your skin becomes reddened/irritated stop using the CHG and inform your nurse when you arrive at Short Stay. Do not shave (including legs and underarms) for at least 48 hours prior to the first CHG shower.  You may shave your face/neck.  Please follow these instructions carefully:  1.  Shower with CHG Soap the night before surgery and the  morning of surgery.  2.  If you choose to wash your hair, wash your hair first as usual with your normal  shampoo.  3.  After you shampoo, rinse your hair and body thoroughly to remove the shampoo.                             4.  Use CHG as you would any other liquid soap.  You can apply chg directly to the skin and wash.  Gently with a scrungie or clean washcloth.  5.  Apply the CHG Soap to your body ONLY FROM THE NECK DOWN.   Do   not use on face/ open                           Wound or open sores. Avoid contact with eyes, ears mouth and   genitals (private parts).                       Wash face,  Genitals (private parts) with your normal soap.             6.  Wash thoroughly, paying special attention to the area where your    surgery  will be performed.  7.  Thoroughly rinse your body with warm water from the neck down.  8.  DO NOT shower/wash with your normal soap after using and rinsing off the CHG Soap.                9.  Pat yourself dry with a clean towel.            10.  Wear clean pajamas.            11.  Place clean sheets on your bed the night of your first shower and do not  sleep with pets. Day of Surgery : Do not apply any lotions/deodorants the morning of surgery.  Please wear clean clothes to the hospital/surgery center.  FAILURE TO FOLLOW THESE INSTRUCTIONS MAY RESULT IN THE CANCELLATION OF YOUR SURGERY  PATIENT  SIGNATURE_________________________________  NURSE SIGNATURE__________________________________  ________________________________________________________________________   Joshua MireIncentive Spirometer  An incentive spirometer is a tool that can help keep your lungs clear and active. This tool measures how well you are filling your lungs with each breath. Taking long deep breaths may help reverse or decrease the chance of developing breathing (pulmonary) problems (especially infection) following:  A long period of time when you are unable to move or be active. BEFORE THE PROCEDURE   If the spirometer includes an indicator to show your best effort, your nurse or respiratory therapist will set it to a desired goal.  If possible, sit up straight or lean slightly forward. Try not to slouch.  Hold  the incentive spirometer in an upright position. INSTRUCTIONS FOR USE  1. Sit on the edge of your bed if possible, or sit up as far as you can in bed or on a chair. 2. Hold the incentive spirometer in an upright position. 3. Breathe out normally. 4. Place the mouthpiece in your mouth and seal your lips tightly around it. 5. Breathe in slowly and as deeply as possible, raising the piston or the ball toward the top of the column. 6. Hold your breath for 3-5 seconds or for as long as possible. Allow the piston or ball to fall to the bottom of the column. 7. Remove the mouthpiece from your mouth and breathe out normally. 8. Rest for a few seconds and repeat Steps 1 through 7 at least 10 times every 1-2 hours when you are awake. Take your time and take a few normal breaths between deep breaths. 9. The spirometer may include an indicator to show your best effort. Use the indicator as a goal to work toward during each repetition. 10. After each set of 10 deep breaths, practice coughing to be sure your lungs are clear. If you have an incision (the cut made at the time of surgery), support your incision when coughing  by placing a pillow or rolled up towels firmly against it. Once you are able to get out of bed, walk around indoors and cough well. You may stop using the incentive spirometer when instructed by your caregiver.  RISKS AND COMPLICATIONS  Take your time so you do not get dizzy or light-headed.  If you are in pain, you may need to take or ask for pain medication before doing incentive spirometry. It is harder to take a deep breath if you are having pain. AFTER USE  Rest and breathe slowly and easily.  It can be helpful to keep track of a log of your progress. Your caregiver can provide you with a simple table to help with this. If you are using the spirometer at home, follow these instructions: SEEK MEDICAL CARE IF:   You are having difficultly using the spirometer.  You have trouble using the spirometer as often as instructed.  Your pain medication is not giving enough relief while using the spirometer.  You develop fever of 100.5 F (38.1 C) or higher. SEEK IMMEDIATE MEDICAL CARE IF:   You cough up bloody sputum that had not been present before.  You develop fever of 102 F (38.9 C) or greater.  You develop worsening pain at or near the incision site. MAKE SURE YOU:   Understand these instructions.  Will watch your condition.  Will get help right away if you are not doing well or get worse. Document Released: 06/14/2006 Document Revised: 04/26/2011 Document Reviewed: 08/15/2006 Exit[employer] Patient Information 2014 ExitCare, [place].   ________________________________________________________________________  WHAT IS A BLOOD TRANSFUSION? Blood Transfusion Information  A transfusion is the replacement of blood or some of its parts. Blood is made up of multiple cells which provide different functions.  Red blood cells carry oxygen and are used for blood loss replacement.  White blood cells fight against infection.  Platelets control bleeding.  Plasma helps clot  blood.  Other blood products are available for specialized needs, such as hemophilia or other clotting disorders. BEFORE THE TRANSFUSION  Who gives blood for transfusions?   Healthy volunteers who are fully evaluated to make sure their blood is safe. This is blood bank blood. Transfusion therapy is the safest it has ever been  in the practice of medicine. Before blood is taken from a donor, a complete history is taken to make sure that person has no history of diseases nor engages in risky social behavior (examples are intravenous drug use or sexual activity with multiple partners). The donor's travel history is screened to minimize risk of transmitting infections, such as malaria. The donated blood is tested for signs of infectious diseases, such as HIV and hepatitis. The blood is then tested to be sure it is compatible with you in order to minimize the chance of a transfusion reaction. If you or a relative donates blood, this is often done in anticipation of surgery and is not appropriate for emergency situations. It takes many days to process the donated blood. RISKS AND COMPLICATIONS Although transfusion therapy is very safe and saves many lives, the main dangers of transfusion include:   Getting an infectious disease.  Developing a transfusion reaction. This is an allergic reaction to something in the blood you were given. Every precaution is taken to prevent this. The decision to have a blood transfusion has been considered carefully by your caregiver before blood is given. Blood is not given unless the benefits outweigh the risks. AFTER THE TRANSFUSION  Right after receiving a blood transfusion, you will usually feel much better and more energetic. This is especially true if your red blood cells have gotten low (anemic). The transfusion raises the level of the red blood cells which carry oxygen, and this usually causes an energy increase.  The nurse administering the transfusion will monitor  you carefully for complications. HOME CARE INSTRUCTIONS  No special instructions are needed after a transfusion. You may find your energy is better. Speak with your caregiver about any limitations on activity for underlying diseases you may have. SEEK MEDICAL CARE IF:   Your condition is not improving after your transfusion.  You develop redness or irritation at the intravenous (IV) site. SEEK IMMEDIATE MEDICAL CARE IF:  Any of the following symptoms occur over the next 12 hours:  Shaking chills.  You have a temperature by mouth above 102 F (38.9 C), not controlled by medicine.  Chest, back, or muscle pain.  People around you feel you are not acting correctly or are confused.  Shortness of breath or difficulty breathing.  Dizziness and fainting.  You get a rash or develop hives.  You have a decrease in urine output.  Your urine turns a dark color or changes to pink, red, or brown. Any of the following symptoms occur over the next 10 days:  You have a temperature by mouth above 102 F (38.9 C), not controlled by medicine.  Shortness of breath.  Weakness after normal activity.  The white part of the eye turns yellow (jaundice).  You have a decrease in the amount of urine or are urinating less often.  Your urine turns a dark color or changes to pink, red, or brown. Document Released: 01/30/2000 Document Revised: 04/26/2011 Document Reviewed: 09/18/2007 [facility] Patient Information 2014 [place], [place].  _______________________________________________________________________

## 2018-11-16 ENCOUNTER — Encounter (HOSPITAL_COMMUNITY): Payer: Self-pay

## 2018-11-16 ENCOUNTER — Other Ambulatory Visit: Payer: Self-pay

## 2018-11-16 ENCOUNTER — Encounter (HOSPITAL_COMMUNITY)
Admission: RE | Admit: 2018-11-16 | Discharge: 2018-11-16 | Disposition: A | Discharge status: Home / Self Care | Payer: 59 | Source: Ambulatory Visit | Attending: General Surgery | Admitting: General Surgery

## 2018-11-16 ENCOUNTER — Other Ambulatory Visit (HOSPITAL_COMMUNITY)
Admission: RE | Admit: 2018-11-16 | Discharge: 2018-11-16 | Disposition: A | Discharge status: Home / Self Care | Payer: 59 | Source: Ambulatory Visit | Attending: General Surgery | Admitting: General Surgery

## 2018-11-16 DIAGNOSIS — Z20828 Contact with and (suspected) exposure to other viral communicable diseases: Secondary | ICD-10-CM | POA: Diagnosis not present

## 2018-11-16 DIAGNOSIS — Z01812 Encounter for preprocedural laboratory examination: Secondary | ICD-10-CM | POA: Diagnosis not present

## 2018-11-16 HISTORY — DX: Unspecified osteoarthritis, unspecified site: M19.90

## 2018-11-16 HISTORY — DX: Gastro-esophageal reflux disease without esophagitis: K21.9

## 2018-11-16 HISTORY — DX: Nausea with vomiting, unspecified: R11.2

## 2018-11-16 HISTORY — DX: Localized adiposity: E65

## 2018-11-16 HISTORY — DX: Personal history of other diseases of the nervous system and sense organs: Z86.69

## 2018-11-16 HISTORY — DX: Low back pain, unspecified: M54.50

## 2018-11-16 HISTORY — DX: Obstructive sleep apnea (adult) (pediatric): G47.33

## 2018-11-16 HISTORY — DX: Pneumonia, unspecified organism: J18.9

## 2018-11-16 HISTORY — DX: Nausea with vomiting, unspecified: Z98.890

## 2018-11-16 LAB — CBC WITH DIFFERENTIAL/PLATELET
Abs Immature Granulocytes: 0.02 10*3/uL (ref 0.00–0.07)
Basophils Absolute: 0 10*3/uL (ref 0.0–0.1)
Basophils Relative: 1 %
Eosinophils Absolute: 0.1 10*3/uL (ref 0.0–0.5)
Eosinophils Relative: 2 %
HCT: 43.8 % (ref 39.0–52.0)
Hemoglobin: 14.7 g/dL (ref 13.0–17.0)
Immature Granulocytes: 0 %
Lymphocytes Relative: 31 %
Lymphs Abs: 2.3 10*3/uL (ref 0.7–4.0)
MCH: 31.7 pg (ref 26.0–34.0)
MCHC: 33.6 g/dL (ref 30.0–36.0)
MCV: 94.4 fL (ref 80.0–100.0)
Monocytes Absolute: 0.7 10*3/uL (ref 0.1–1.0)
Monocytes Relative: 9 %
Neutro Abs: 4.2 10*3/uL (ref 1.7–7.7)
Neutrophils Relative %: 57 %
Platelets: 226 10*3/uL (ref 150–400)
RBC: 4.64 MIL/uL (ref 4.22–5.81)
RDW: 12.7 % (ref 11.5–15.5)
WBC: 7.3 10*3/uL (ref 4.0–10.5)
nRBC: 0 % (ref 0.0–0.2)

## 2018-11-16 LAB — COMPREHENSIVE METABOLIC PANEL
ALT: 32 U/L (ref 0–44)
AST: 23 U/L (ref 15–41)
Albumin: 4.2 g/dL (ref 3.5–5.0)
Alkaline Phosphatase: 77 U/L (ref 38–126)
Anion gap: 9 (ref 5–15)
BUN: 25 mg/dL — ABNORMAL HIGH (ref 8–23)
CO2: 23 mmol/L (ref 22–32)
Calcium: 9.2 mg/dL (ref 8.9–10.3)
Chloride: 105 mmol/L (ref 98–111)
Creatinine, Ser: 1.15 mg/dL (ref 0.61–1.24)
GFR calc Af Amer: >60 mL/min (ref >60)
GFR calc non Af Amer: >60 mL/min (ref >60)
Glucose, Bld: 112 mg/dL — ABNORMAL HIGH (ref 70–99)
Potassium: 4.7 mmol/L (ref 3.5–5.1)
Sodium: 137 mmol/L (ref 135–145)
Total Bilirubin: 1.1 mg/dL (ref 0.3–1.2)
Total Protein: 7.4 g/dL (ref 6.5–8.1)

## 2018-11-16 LAB — ABO/RH: ABO/RH(D): A POS

## 2018-11-16 MED ORDER — ENSURE PRE-SURGERY PO LIQD
296.0000 mL | Freq: Once | ORAL | Status: DC
  Filled 2018-11-16: qty 296

## 2018-11-16 NOTE — Progress Notes (Signed)
PCP - Dr. Rolley Sims [place] Cardiologist - N/A  Chest x-ray - 03/10/2018 in epic EKG - 03/10/2018 in epic Stress Test - N/A ECHO - N/A Cardiac Cath -N/A  Sleep Study - Performed in [place] CPAP - YES  Fasting Blood Sugar - N/A Checks Blood Sugar __N/A___ times a day  Blood Thinner Instructions:N/A Aspirin Instructions:N/A Last Dose:N/A  Anesthesia review: OSA, HTN  Patient denies shortness of breath, fever, cough and chest pain at PAT appointment   Patient verbalized understanding of instructions that were given to them at the PAT appointment. Patient was also instructed that they will need to review over the PAT instructions again at home before surgery.

## 2018-11-16 NOTE — Progress Notes (Signed)
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 NO

## 2018-11-17 LAB — NOVEL CORONAVIRUS, NAA (HOSP ORDER, SEND-OUT TO REF LAB; TAT 18-24 HRS): SARS-CoV-2, NAA: NOT DETECTED

## 2018-11-19 MED ORDER — BUPIVACAINE LIPOSOME 1.3 % IJ SUSP
20.0000 mL | INTRAMUSCULAR | Status: DC
  Filled 2018-11-19: qty 20

## 2018-11-20 ENCOUNTER — Encounter (HOSPITAL_COMMUNITY)
Admission: RE | Disposition: A | Discharge status: Home / Self Care | Payer: Self-pay | Source: Home / Self Care | Attending: General Surgery

## 2018-11-20 ENCOUNTER — Other Ambulatory Visit: Payer: Self-pay

## 2018-11-20 ENCOUNTER — Inpatient Hospital Stay (HOSPITAL_COMMUNITY)
Admission: RE | Admit: 2018-11-20 | Discharge: 2018-11-21 | DRG: 621 | Disposition: A | Discharge status: Home / Self Care | Payer: 59 | Attending: General Surgery | Admitting: General Surgery

## 2018-11-20 ENCOUNTER — Inpatient Hospital Stay (HOSPITAL_COMMUNITY): Payer: 59 | Admitting: Anesthesiology

## 2018-11-20 ENCOUNTER — Inpatient Hospital Stay (HOSPITAL_COMMUNITY): Payer: 59 | Admitting: Physician Assistant

## 2018-11-20 ENCOUNTER — Encounter (HOSPITAL_COMMUNITY): Payer: Self-pay | Admitting: Anesthesiology

## 2018-11-20 DIAGNOSIS — M160 Bilateral primary osteoarthritis of hip: Secondary | ICD-10-CM | POA: Diagnosis present

## 2018-11-20 DIAGNOSIS — K449 Diaphragmatic hernia without obstruction or gangrene: Secondary | ICD-10-CM | POA: Diagnosis present

## 2018-11-20 DIAGNOSIS — I10 Essential (primary) hypertension: Secondary | ICD-10-CM | POA: Diagnosis not present

## 2018-11-20 DIAGNOSIS — G4733 Obstructive sleep apnea (adult) (pediatric): Secondary | ICD-10-CM | POA: Diagnosis present

## 2018-11-20 DIAGNOSIS — K219 Gastro-esophageal reflux disease without esophagitis: Secondary | ICD-10-CM | POA: Diagnosis present

## 2018-11-20 DIAGNOSIS — E669 Obesity, unspecified: Secondary | ICD-10-CM | POA: Diagnosis present

## 2018-11-20 DIAGNOSIS — Z6841 Body Mass Index (BMI) 40.0 and over, adult: Secondary | ICD-10-CM | POA: Diagnosis not present

## 2018-11-20 DIAGNOSIS — Z8701 Personal history of pneumonia (recurrent): Secondary | ICD-10-CM

## 2018-11-20 HISTORY — PX: GASTRIC ROUX-EN-Y: SHX5262

## 2018-11-20 LAB — HEMOGLOBIN AND HEMATOCRIT, BLOOD
HCT: 46.3 % (ref 39.0–52.0)
Hemoglobin: 15.7 g/dL (ref 13.0–17.0)

## 2018-11-20 LAB — TYPE AND SCREEN
ABO/RH(D): A POS
Antibody Screen: NEGATIVE

## 2018-11-20 SURGERY — LAPAROSCOPIC ROUX-EN-Y GASTRIC BYPASS WITH UPPER ENDOSCOPY
Anesthesia: General | Site: Abdomen

## 2018-11-20 MED ORDER — SCOPOLAMINE 1 MG/3DAYS TD PT72
1.0000 | MEDICATED_PATCH | TRANSDERMAL | Status: DC
  Administered 2018-11-20: 1.5 mg via TRANSDERMAL
  Filled 2018-11-20: qty 1

## 2018-11-20 MED ORDER — MIDAZOLAM HCL 5 MG/5ML IJ SOLN
INTRAMUSCULAR | Status: DC | PRN
  Administered 2018-11-20: 2 mg via INTRAVENOUS

## 2018-11-20 MED ORDER — PROMETHAZINE HCL 25 MG/ML IJ SOLN: 6.2500 mg | INTRAMUSCULAR | Status: DC | PRN

## 2018-11-20 MED ORDER — GABAPENTIN 300 MG PO CAPS
300.0000 mg | ORAL_CAPSULE | ORAL | Status: AC
  Administered 2018-11-20: 300 mg via ORAL
  Filled 2018-11-20: qty 1

## 2018-11-20 MED ORDER — BUPIVACAINE LIPOSOME 1.3 % IJ SUSP
INTRAMUSCULAR | Status: DC | PRN
  Administered 2018-11-20: 20 mL

## 2018-11-20 MED ORDER — ONDANSETRON HCL 4 MG/2ML IJ SOLN
INTRAMUSCULAR | Status: AC
  Filled 2018-11-20: qty 2

## 2018-11-20 MED ORDER — DEXAMETHASONE SODIUM PHOSPHATE 4 MG/ML IJ SOLN: 4.0000 mg | INTRAMUSCULAR | Status: DC

## 2018-11-20 MED ORDER — CHLORHEXIDINE GLUCONATE 4 % EX LIQD: 60.0000 mL | Freq: Once | CUTANEOUS | Status: DC

## 2018-11-20 MED ORDER — LACTATED RINGERS IV SOLN
INTRAVENOUS | Status: DC
  Administered 2018-11-20 (×2): via INTRAVENOUS
  Administered 2018-11-20: 1000 mL via INTRAVENOUS
  Administered 2018-11-20: 10:00:00 via INTRAVENOUS

## 2018-11-20 MED ORDER — LIDOCAINE HCL (CARDIAC) PF 100 MG/5ML IV SOSY
PREFILLED_SYRINGE | INTRAVENOUS | Status: DC | PRN
  Administered 2018-11-20: 60 mg via INTRAVENOUS

## 2018-11-20 MED ORDER — ACETAMINOPHEN 500 MG PO TABS
1000.0000 mg | ORAL_TABLET | ORAL | Status: AC
  Administered 2018-11-20: 10:00:00 1000 mg via ORAL
  Filled 2018-11-20: qty 2

## 2018-11-20 MED ORDER — LACTATED RINGERS IR SOLN
Status: DC | PRN
  Administered 2018-11-20: 1000 mL

## 2018-11-20 MED ORDER — EPHEDRINE SULFATE 50 MG/ML IJ SOLN
INTRAMUSCULAR | Status: DC | PRN
  Administered 2018-11-20 (×2): 5 mg via INTRAVENOUS
  Administered 2018-11-20: 7 mg via INTRAVENOUS

## 2018-11-20 MED ORDER — ONDANSETRON HCL 4 MG/2ML IJ SOLN
INTRAMUSCULAR | Status: DC | PRN
  Administered 2018-11-20: 4 mg via INTRAVENOUS

## 2018-11-20 MED ORDER — PROPOFOL 10 MG/ML IV BOLUS
INTRAVENOUS | Status: AC
  Filled 2018-11-20: qty 20

## 2018-11-20 MED ORDER — LIDOCAINE HCL 2 % IJ SOLN
INTRAMUSCULAR | Status: AC
  Filled 2018-11-20: qty 20

## 2018-11-20 MED ORDER — FENTANYL CITRATE (PF) 250 MCG/5ML IJ SOLN
INTRAMUSCULAR | Status: AC
  Filled 2018-11-20: qty 5

## 2018-11-20 MED ORDER — BUPIVACAINE HCL (PF) 0.25 % IJ SOLN
INTRAMUSCULAR | Status: AC
  Filled 2018-11-20: qty 30

## 2018-11-20 MED ORDER — ENSURE MAX PROTEIN PO LIQD
2.0000 [oz_av] | ORAL | Status: DC
  Administered 2018-11-21 (×4): 2 [oz_av] via ORAL

## 2018-11-20 MED ORDER — MIDAZOLAM HCL 2 MG/2ML IJ SOLN
INTRAMUSCULAR | Status: AC
  Filled 2018-11-20: qty 2

## 2018-11-20 MED ORDER — MORPHINE SULFATE (PF) 4 MG/ML IV SOLN
1.0000 mg | INTRAVENOUS | Status: DC | PRN
  Administered 2018-11-20: 2 mg via INTRAVENOUS
  Filled 2018-11-20: qty 1

## 2018-11-20 MED ORDER — PHENYLEPHRINE 40 MCG/ML (10ML) SYRINGE FOR IV PUSH (FOR BLOOD PRESSURE SUPPORT)
PREFILLED_SYRINGE | INTRAVENOUS | Status: DC | PRN
  Administered 2018-11-20: 40 ug via INTRAVENOUS
  Administered 2018-11-20 (×2): 120 ug via INTRAVENOUS

## 2018-11-20 MED ORDER — ONDANSETRON HCL 4 MG/2ML IJ SOLN: 4.0000 mg | INTRAMUSCULAR | Status: DC | PRN

## 2018-11-20 MED ORDER — KETAMINE HCL 10 MG/ML IJ SOLN
INTRAMUSCULAR | Status: DC | PRN
  Administered 2018-11-20: 40 mg via INTRAVENOUS

## 2018-11-20 MED ORDER — LIDOCAINE 20MG/ML (2%) 15 ML SYRINGE OPTIME
INTRAMUSCULAR | Status: DC | PRN
  Administered 2018-11-20: 1.5 mg/kg/h via INTRAVENOUS

## 2018-11-20 MED ORDER — FENTANYL CITRATE (PF) 100 MCG/2ML IJ SOLN
INTRAMUSCULAR | Status: AC
  Filled 2018-11-20: qty 2

## 2018-11-20 MED ORDER — ENOXAPARIN SODIUM 30 MG/0.3ML ~~LOC~~ SOLN
30.0000 mg | Freq: Two times a day (BID) | SUBCUTANEOUS | Status: DC
  Administered 2018-11-20 – 2018-11-21 (×2): 30 mg via SUBCUTANEOUS
  Filled 2018-11-20 (×2): qty 0.3

## 2018-11-20 MED ORDER — 0.9 % SODIUM CHLORIDE (POUR BTL) OPTIME
TOPICAL | Status: DC | PRN
  Administered 2018-11-20: 1000 mL

## 2018-11-20 MED ORDER — KETAMINE HCL 10 MG/ML IJ SOLN
INTRAMUSCULAR | Status: AC
  Filled 2018-11-20: qty 1

## 2018-11-20 MED ORDER — ROCURONIUM BROMIDE 100 MG/10ML IV SOLN
INTRAVENOUS | Status: DC | PRN
  Administered 2018-11-20: 80 mg via INTRAVENOUS
  Administered 2018-11-20: 10 mg via INTRAVENOUS

## 2018-11-20 MED ORDER — BUPIVACAINE HCL 0.25 % IJ SOLN
INTRAMUSCULAR | Status: DC | PRN
  Administered 2018-11-20: 30 mL

## 2018-11-20 MED ORDER — EPHEDRINE 5 MG/ML INJ
INTRAVENOUS | Status: AC
  Filled 2018-11-20: qty 10

## 2018-11-20 MED ORDER — DEXTROSE-NACL 5-0.45 % IV SOLN
INTRAVENOUS | Status: DC
  Administered 2018-11-20 – 2018-11-21 (×2): via INTRAVENOUS

## 2018-11-20 MED ORDER — KETOROLAC TROMETHAMINE 30 MG/ML IJ SOLN: 30.0000 mg | Freq: Once | INTRAMUSCULAR | Status: DC | PRN

## 2018-11-20 MED ORDER — SODIUM CHLORIDE 0.9 % IV SOLN
2.0000 g | INTRAVENOUS | Status: AC
  Administered 2018-11-20: 11:00:00 2 g via INTRAVENOUS
  Filled 2018-11-20: qty 2

## 2018-11-20 MED ORDER — HEPARIN SODIUM (PORCINE) 5000 UNIT/ML IJ SOLN
5000.0000 [IU] | INTRAMUSCULAR | Status: AC
  Administered 2018-11-20: 5000 [IU] via SUBCUTANEOUS
  Filled 2018-11-20: qty 1

## 2018-11-20 MED ORDER — FAMOTIDINE IN NACL 20-0.9 MG/50ML-% IV SOLN
20.0000 mg | Freq: Two times a day (BID) | INTRAVENOUS | Status: DC
  Administered 2018-11-20 – 2018-11-21 (×2): 20 mg via INTRAVENOUS
  Filled 2018-11-20 (×3): qty 50

## 2018-11-20 MED ORDER — BENAZEPRIL HCL 20 MG PO TABS
20.0000 mg | ORAL_TABLET | Freq: Every day | ORAL | Status: DC
  Administered 2018-11-21: 10:00:00 20 mg via ORAL
  Filled 2018-11-20: qty 1
  Filled 2018-11-20: qty 2

## 2018-11-20 MED ORDER — GABAPENTIN 100 MG PO CAPS
200.0000 mg | ORAL_CAPSULE | Freq: Two times a day (BID) | ORAL | Status: DC
  Administered 2018-11-21: 10:00:00 200 mg via ORAL
  Filled 2018-11-20 (×3): qty 2

## 2018-11-20 MED ORDER — FENTANYL CITRATE (PF) 100 MCG/2ML IJ SOLN
INTRAMUSCULAR | Status: DC | PRN
  Administered 2018-11-20: 100 ug via INTRAVENOUS
  Administered 2018-11-20: 25 ug via INTRAVENOUS
  Administered 2018-11-20 (×2): 50 ug via INTRAVENOUS
  Administered 2018-11-20: 25 ug via INTRAVENOUS

## 2018-11-20 MED ORDER — ENOXAPARIN (LOVENOX) PATIENT EDUCATION KIT
PACK | Freq: Once | Status: AC
  Administered 2018-11-21: 12:00:00
  Filled 2018-11-20: qty 1

## 2018-11-20 MED ORDER — FENTANYL CITRATE (PF) 100 MCG/2ML IJ SOLN
25.0000 ug | INTRAMUSCULAR | Status: DC | PRN
  Administered 2018-11-20 (×3): 50 ug via INTRAVENOUS

## 2018-11-20 MED ORDER — PROPOFOL 10 MG/ML IV BOLUS
INTRAVENOUS | Status: DC | PRN
  Administered 2018-11-20: 200 mg via INTRAVENOUS

## 2018-11-20 MED ORDER — APREPITANT 40 MG PO CAPS
40.0000 mg | ORAL_CAPSULE | ORAL | Status: AC
  Administered 2018-11-20: 10:00:00 40 mg via ORAL
  Filled 2018-11-20: qty 1

## 2018-11-20 MED ORDER — OXYCODONE HCL 5 MG/5ML PO SOLN
5.0000 mg | Freq: Four times a day (QID) | ORAL | Status: DC | PRN
  Administered 2018-11-20: 5 mg via ORAL
  Filled 2018-11-20: qty 5

## 2018-11-20 MED ORDER — DEXAMETHASONE SODIUM PHOSPHATE 10 MG/ML IJ SOLN
INTRAMUSCULAR | Status: DC | PRN
  Administered 2018-11-20: 8 mg via INTRAVENOUS

## 2018-11-20 MED ORDER — ACETAMINOPHEN 160 MG/5ML PO SOLN
1000.0000 mg | Freq: Three times a day (TID) | ORAL | Status: DC
  Filled 2018-11-20: qty 40.6

## 2018-11-20 MED ORDER — ACETAMINOPHEN 500 MG PO TABS
1000.0000 mg | ORAL_TABLET | Freq: Three times a day (TID) | ORAL | Status: DC
  Administered 2018-11-20 – 2018-11-21 (×2): 1000 mg via ORAL
  Filled 2018-11-20 (×2): qty 2

## 2018-11-20 MED ORDER — SIMETHICONE 80 MG PO CHEW: 80.0000 mg | CHEWABLE_TABLET | Freq: Four times a day (QID) | ORAL | Status: DC | PRN

## 2018-11-20 MED ORDER — SUGAMMADEX SODIUM 200 MG/2ML IV SOLN
INTRAVENOUS | Status: DC | PRN
  Administered 2018-11-20: 300 mg via INTRAVENOUS

## 2018-11-20 SURGICAL SUPPLY — 77 items
APPLIER CLIP 5 13 M/L LIGAMAX5 (MISCELLANEOUS)
APPLIER CLIP ROT 10 11.4 M/L (STAPLE)
APPLIER CLIP ROT 13.4 12 LRG (CLIP)
BENZOIN TINCTURE PRP APPL 2/3 (GAUZE/BANDAGES/DRESSINGS) ×2 IMPLANT
BLADE SURG SZ11 CARB STEEL (BLADE) ×3 IMPLANT
BNDG ADH 1X3 SHEER STRL LF (GAUZE/BANDAGES/DRESSINGS) ×12 IMPLANT
CABLE HIGH FREQUENCY MONO STRZ (ELECTRODE) ×1 IMPLANT
CHLORAPREP W/TINT 26 (MISCELLANEOUS) ×5 IMPLANT
CLIP APPLIE 5 13 M/L LIGAMAX5 (MISCELLANEOUS) IMPLANT
CLIP APPLIE ROT 10 11.4 M/L (STAPLE) IMPLANT
CLIP APPLIE ROT 13.4 12 LRG (CLIP) IMPLANT
CLOSURE WOUND 1/2 X4 (GAUZE/BANDAGES/DRESSINGS) ×1
COVER SURGICAL LIGHT HANDLE (MISCELLANEOUS) ×3 IMPLANT
COVER WAND RF STERILE (DRAPES) ×3 IMPLANT
DEVICE SUTURE ENDOST 10MM (ENDOMECHANICALS) ×3 IMPLANT
DRAIN CHANNEL 19F RND (DRAIN) IMPLANT
DRAIN PENROSE 18X1/4 LTX STRL (WOUND CARE) ×3 IMPLANT
ELECT L-HOOK LAP 45CM DISP (ELECTROSURGICAL) ×3
ELECT PENCIL ROCKER SW 15FT (MISCELLANEOUS) ×3 IMPLANT
ELECTRODE L-HOOK LAP 45CM DISP (ELECTROSURGICAL) ×1 IMPLANT
EVACUATOR SILICONE 100CC (DRAIN) IMPLANT
GAUZE 4X4 16PLY RFD (DISPOSABLE) ×3 IMPLANT
GLOVE BIO SURGEON STRL SZ 6 (GLOVE) ×2 IMPLANT
GLOVE BIO SURGEON STRL SZ7 (GLOVE) ×2 IMPLANT
GLOVE BIOGEL PI IND STRL 6.5 (GLOVE) IMPLANT
GLOVE BIOGEL PI IND STRL 7.0 (GLOVE) ×1 IMPLANT
GLOVE BIOGEL PI IND STRL 7.5 (GLOVE) IMPLANT
GLOVE BIOGEL PI INDICATOR 6.5 (GLOVE) ×2
GLOVE BIOGEL PI INDICATOR 7.0 (GLOVE) ×6
GLOVE BIOGEL PI INDICATOR 7.5 (GLOVE) ×2
GLOVE SURG SS PI 7.0 STRL IVOR (GLOVE) ×5 IMPLANT
GOWN STRL REUS W/TWL LRG LVL3 (GOWN DISPOSABLE) ×5 IMPLANT
GOWN STRL REUS W/TWL XL LVL3 (GOWN DISPOSABLE) ×9 IMPLANT
GRASPER SUT TROCAR 14GX15 (MISCELLANEOUS) IMPLANT
HANDLE STAPLE EGIA 4 XL (STAPLE) ×3 IMPLANT
HOVERMATT SINGLE USE (MISCELLANEOUS) ×3 IMPLANT
KIT BASIN OR (CUSTOM PROCEDURE TRAY) ×3 IMPLANT
KIT GASTRIC LAVAGE 34FR ADT (SET/KITS/TRAYS/PACK) IMPLANT
KIT TURNOVER KIT A (KITS) IMPLANT
MARKER SKIN DUAL TIP RULER LAB (MISCELLANEOUS) ×3 IMPLANT
NDL SPNL 22GX3.5 QUINCKE BK (NEEDLE) ×1 IMPLANT
NEEDLE SPNL 22GX3.5 QUINCKE BK (NEEDLE) ×3 IMPLANT
PACK CARDIOVASCULAR III (CUSTOM PROCEDURE TRAY) ×3 IMPLANT
RELOAD EGIA 45 MED/THCK PURPLE (STAPLE) ×3 IMPLANT
RELOAD EGIA 45 TAN VASC (STAPLE) IMPLANT
RELOAD EGIA 60 MED/THCK PURPLE (STAPLE) ×12 IMPLANT
RELOAD EGIA 60 TAN VASC (STAPLE) ×9 IMPLANT
RELOAD ENDO STITCH 2.0 (ENDOMECHANICALS) ×22
RELOAD STAPLE 60 MED/THCK ART (STAPLE) ×4 IMPLANT
RELOAD SUT SNGL STCH ABSRB 2-0 (ENDOMECHANICALS) ×5 IMPLANT
RELOAD SUT SNGL STCH BLK 2-0 (ENDOMECHANICALS) ×5 IMPLANT
SCISSORS LAP 5X45 EPIX DISP (ENDOMECHANICALS) ×3 IMPLANT
SET IRRIG TUBING LAPAROSCOPIC (IRRIGATION / IRRIGATOR) ×3 IMPLANT
SET TUBE SMOKE EVAC HIGH FLOW (TUBING) ×3 IMPLANT
SHEARS HARMONIC ACE PLUS 45CM (MISCELLANEOUS) ×3 IMPLANT
SLEEVE XCEL OPT CAN 5 100 (ENDOMECHANICALS) ×11 IMPLANT
SOL ANTI FOG 6CC (MISCELLANEOUS) ×1 IMPLANT
SOLUTION ANTI FOG 6CC (MISCELLANEOUS) ×2
STAPLER VISISTAT 35W (STAPLE) IMPLANT
STRIP CLOSURE SKIN 1/2X4 (GAUZE/BANDAGES/DRESSINGS) ×1 IMPLANT
SUT ETHIBOND 0 36 GRN (SUTURE) ×2 IMPLANT
SUT ETHILON 2 0 PS N (SUTURE) IMPLANT
SUT MNCRL AB 4-0 PS2 18 (SUTURE) ×3 IMPLANT
SUT RELOAD ENDO STITCH 2 48X1 (ENDOMECHANICALS) ×6
SUT RELOAD ENDO STITCH 2.0 (ENDOMECHANICALS) ×5
SUT SILK 0 SH 30 (SUTURE) IMPLANT
SUT VICRYL 0 TIES 12 18 (SUTURE) IMPLANT
SUTURE RELOAD END STTCH 2 48X1 (ENDOMECHANICALS) ×6 IMPLANT
SUTURE RELOAD ENDO STITCH 2.0 (ENDOMECHANICALS) ×5 IMPLANT
SYR 20ML LL LF (SYRINGE) ×3 IMPLANT
SYR 50ML LL SCALE MARK (SYRINGE) ×3 IMPLANT
TOWEL OR 17X26 10 PK STRL BLUE (TOWEL DISPOSABLE) ×3 IMPLANT
TOWEL OR NON WOVEN STRL DISP B (DISPOSABLE) ×3 IMPLANT
TROCAR BLADELESS OPT 5 100 (ENDOMECHANICALS) ×3 IMPLANT
TROCAR XCEL 12X100 BLDLESS (ENDOMECHANICALS) ×3 IMPLANT
TUBING CONNECTING 10 (TUBING) ×2 IMPLANT
TUBING CONNECTING 10' (TUBING) ×1

## 2018-11-20 NOTE — Anesthesia Preprocedure Evaluation (Signed)
Anesthesia Evaluation  Patient identified by MRN, date of birth, ID band Patient awake    Reviewed: Allergy & Precautions, NPO status , Patient's Chart, lab work & pertinent test results  History of Anesthesia Complications (+) PONV  Airway Mallampati: III  TM Distance: <3 FB Neck ROM: Full    Dental no notable dental hx.    Pulmonary sleep apnea ,    Pulmonary exam normal breath sounds clear to auscultation       Cardiovascular hypertension, Normal cardiovascular exam Rhythm:Regular Rate:Normal     Neuro/Psych negative neurological ROS  negative psych ROS   GI/Hepatic Neg liver ROS, GERD  Medicated,  Endo/Other  Morbid obesity  Renal/GU negative Renal ROS  negative genitourinary   Musculoskeletal negative musculoskeletal ROS (+)   Abdominal   Peds negative pediatric ROS (+)  Hematology negative hematology ROS (+)   Anesthesia Other Findings   Reproductive/Obstetrics negative OB ROS                             Anesthesia Physical Anesthesia Plan  ASA: III  Anesthesia Plan: General   Post-op Pain Management:    Induction: Intravenous  PONV Risk Score and Plan: 4 or greater and Ondansetron, Dexamethasone, Scopolamine patch - Pre-op, Treatment may vary due to age or medical condition and Midazolam  Airway Management Planned: Oral ETT  Additional Equipment:   Intra-op Plan:   Post-operative Plan: Extubation in OR  Informed Consent: I have reviewed the patients History and Physical, chart, labs and discussed the procedure including the risks, benefits and alternatives for the proposed anesthesia with the patient or authorized representative who has indicated his/her understanding and acceptance.     Dental advisory given  Plan Discussed with: CRNA and Surgeon  Anesthesia Plan Comments:         Anesthesia Quick Evaluation

## 2018-11-20 NOTE — Op Note (Signed)
Name:  Joshua Cochran MRN: [id number] Date of Surgery: 11/20/2018  Preop Diagnosis:  Morbid Obesity, S/P RYGB  Postop Diagnosis:  Morbid Obesity, S/P RYGB (Weight - 138 kg, BMI - 42.5)  Procedure:  Upper endoscopy  (Intraoperative)  Surgeon:  Alphonsa Overall, M.D.  Anesthesia:  GET  Indications for procedure: Joshua Cochran is a 68 y.o. male whose primary care physician is Neale Burly, MD and has completed a Roux-en-Y gastric bypass today by Dr. Kieth Brightly.  I am doing an intraoperative upper endoscopy to evaluate the gastric pouch and the gastro-jejunal anastomosis.  Operative Note: The patient is under general anesthesia.  Dr. Kieth Brightly is laparoscoping the patient while I do an upper endoscopy to evaluate the stomach pouch and gastrojejunal anastomosis.  With the patient intubated, I passed the Olympus endoscope without difficulty down the esophagus.  The esophago-gastric junction was at 40 cm.  The gastro-jejunal anastomosis was at 46 cm.  The mucosa of the stomach looked viable and the staple line was intact without bleeding.  The gastro-jejunal anastomosis looked okay.  While I insufflated the stomach pouch with air, Dr. Kieth Brightly clamped off the efferent limb of the jejunum.  He then flooded the upper abdomen with saline to put the gastric pouch and gastro-jejunal anastomosis under saline.  There was no bubbling or evidence of a leak.    The scope was then withdrawn.  The esophagus was unremarkable and the patient tolerated the endoscopy without difficulty.  Alphonsa Overall, MD, [facility] Surgery Pager: [phone] Office phone:  [phone]

## 2018-11-20 NOTE — Anesthesia Postprocedure Evaluation (Signed)
Anesthesia Post Note  Patient: Joshua Cochran  Procedure(s) Performed: LAPAROSCOPIC ROUX-EN-Y GASTRIC BYPASS AND HIATAL HERNIA REPAIR WITH UPPER ENDOSCOPY, ERAS Pathway (N/A Abdomen)     Patient location during evaluation: PACU Anesthesia Type: General Level of consciousness: awake and alert Pain management: pain level controlled Vital Signs Assessment: post-procedure vital signs reviewed and stable Respiratory status: spontaneous breathing, nonlabored ventilation, respiratory function stable and patient connected to nasal cannula oxygen Cardiovascular status: blood pressure returned to baseline and stable Postop Assessment: no apparent nausea or vomiting Anesthetic complications: no    Last Vitals:  Vitals:   11/20/18 1515 11/20/18 1542  BP: (!) 157/86 (!) 145/83  Pulse:  87  Resp:    Temp: 36.7 C 36.8 C  SpO2:  100%    Last Pain:  Vitals:   11/20/18 1542  TempSrc: Oral  PainSc:                  Amaris Garrette S

## 2018-11-20 NOTE — Anesthesia Procedure Notes (Signed)
Performed by: Flynn-Cook, Jeray Shugart A, CRNA       

## 2018-11-20 NOTE — Anesthesia Procedure Notes (Signed)
Procedure Name: Intubation Date/Time: 11/20/2018 11:50 AM Performed by: Claudia Desanctis, CRNA Pre-anesthesia Checklist: Patient identified, Emergency Drugs available, Suction available and Patient being monitored Patient Re-evaluated:Patient Re-evaluated prior to induction Oxygen Delivery Method: Circle system utilized Preoxygenation: Pre-oxygenation with 100% oxygen Induction Type: IV induction Ventilation: Mask ventilation without difficulty and Oral airway inserted - appropriate to patient size Laryngoscope Size: 2 and Miller Grade View: Grade I Tube type: Oral Tube size: 8.0 mm Number of attempts: 1 Airway Equipment and Method: Stylet Placement Confirmation: ETT inserted through vocal cords under direct vision,  positive ETCO2 and breath sounds checked- equal and bilateral Secured at: 22 cm Tube secured with: Tape Dental Injury: Teeth and Oropharynx as per pre-operative assessment

## 2018-11-20 NOTE — H&P (Signed)
Joshua Cochran is an 68 y.o. male.   Chief Complaint: obesity HPI: 68 yo male with long history of obesity with sleep apnea and HTN. He also has reflux and is on pantoprazole. He presents for bariatric surgery  Past Medical History:  Diagnosis Date  . Abdominal panniculus   . Arthritis    bilateral hips  . Essential hypertension, benign 05/10/2016  . GERD (gastroesophageal reflux disease)   . History of carpal tunnel syndrome    Bilateral  . History of hiatal hernia 2018   3 cm hiatal hernia  . Low back pain    Bulging disk  . OSA (obstructive sleep apnea)   . Pneumonia    history of  . PONV (postoperative nausea and vomiting)     Past Surgical History:  Procedure Laterality Date  . APPENDECTOMY     age 67, ruptured  . CARPAL TUNNEL RELEASE     both wrist in 1994  . COLONOSCOPY    . ESOPHAGOGASTRODUODENOSCOPY N/A 07/08/2016   Procedure: ESOPHAGOGASTRODUODENOSCOPY (EGD);  Surgeon: Malissa Hippo, MD;  Location: [facility];  Service: Endoscopy;  Laterality: N/A;  100    History reviewed. No pertinent family history. Social History:  reports that he has never smoked. He has never used smokeless tobacco. He reports previous alcohol use. He reports current drug use. Drug: Methylphenidate.  Allergies: No Known Allergies  Medications Prior to Admission  Medication Sig Dispense Refill  . acetaminophen (TYLENOL) 325 MG tablet Take 650 mg by mouth every 6 (six) hours as needed for moderate pain.    Marland Kitchen atorvastatin (LIPITOR) 10 MG tablet Take 10 mg by mouth daily.    . benazepril (LOTENSIN) 20 MG tablet Take 20 mg by mouth daily.    . cholecalciferol (VITAMIN D) 1000 units tablet Take 2,000 Units by mouth daily.     . Omega-3 Fatty Acids (FISH OIL) 1000 MG CAPS Take 1,000 mg by mouth 2 (two) times daily.     . pantoprazole (PROTONIX) 40 MG tablet Take 1 tablet (40 mg total) by mouth 2 (two) times daily before a meal. (Patient taking differently: Take 40 mg by mouth daily as needed  (heartburn). ) 60 tablet 5    No results found for this or any previous visit (from the past 48 hour(s)). No results found.  Review of Systems  Constitutional: Negative for chills and fever.  HENT: Negative for hearing loss.   Eyes: Negative for blurred vision and double vision.  Respiratory: Negative for cough and hemoptysis.   Cardiovascular: Negative for chest pain and palpitations.  Gastrointestinal: Negative for abdominal pain, nausea and vomiting.  Genitourinary: Negative for dysuria and urgency.  Musculoskeletal: Negative for myalgias and neck pain.  Skin: Negative for itching and rash.  Neurological: Negative for dizziness, tingling and headaches.  Endo/Heme/Allergies: Does not bruise/bleed easily.  Psychiatric/Behavioral: Negative for depression and suicidal ideas.    Blood pressure 110/71, pulse 78, temperature 98.3 F (36.8 C), temperature source Oral, resp. rate 18, height 5\' 11"  (1.803 m), weight (!) 138.3 kg, SpO2 96 %. Physical Exam  Vitals reviewed. Constitutional: He is oriented to person, place, and time. He appears well-developed and well-nourished.  HENT:  Head: Normocephalic and atraumatic.  Eyes: Pupils are equal, round, and reactive to light. Conjunctivae and EOM are normal.  Neck: Normal range of motion. Neck supple.  Cardiovascular: Normal rate and regular rhythm.  Respiratory: Effort normal and breath sounds normal.  GI: Soft. Bowel sounds are normal. He exhibits no  distension. There is no abdominal tenderness.  Musculoskeletal: Normal range of motion.  Neurological: He is alert and oriented to person, place, and time.  Skin: Skin is warm and dry.  Psychiatric: He has a normal mood and affect. His behavior is normal.     Assessment/Plan 68 yo male with obesity -lap gastric bypass -bariatric protocol  Mickeal Skinner, MD 11/20/2018, 10:30 AM

## 2018-11-20 NOTE — Transfer of Care (Signed)
Immediate Anesthesia Transfer of Care Note  Patient: Joshua Cochran  Procedure(s) Performed: LAPAROSCOPIC ROUX-EN-Y GASTRIC BYPASS AND HIATAL HERNIA REPAIR WITH UPPER ENDOSCOPY, ERAS Pathway (N/A Abdomen)  Patient Location: PACU  Anesthesia Type:General  Level of Consciousness: awake, alert , oriented and patient cooperative  Airway & Oxygen Therapy: Patient Spontanous Breathing and Patient connected to face mask oxygen  Post-op Assessment: Report given to RN and Post -op Vital signs reviewed and stable  Post vital signs: Reviewed and stable  Last Vitals:  Vitals Value Taken Time  BP 127/88 11/20/18 1403  Temp    Pulse 74 11/20/18 1406  Resp 15 11/20/18 1406  SpO2 100 % 11/20/18 1406  Vitals shown include unvalidated device data.  Last Pain:  Vitals:   11/20/18 0955  TempSrc:   PainSc: 0-No pain         Complications: No apparent anesthesia complications

## 2018-11-20 NOTE — Op Note (Signed)
Preop Diagnosis: Obesity Class III  Postop Diagnosis: same  Procedure performed: laparoscopic Roux en Y gastric bypass  Assitant: Ovidio Kin  Indications:  The patient is a 68 y.o. year-old morbidly obese male who has been followed in the Bariatric Clinic as an outpatient. This patient was diagnosed with morbid obesity with a BMI of Body mass index is 42.54 kg/m. and significant co-morbidities including hypertension and sleep apnea.  The patient was counseled extensively in the Bariatric Outpatient Clinic and after a thorough explanation of the risks and benefits of surgery (including death from complications, bowel leak, infection such as peritonitis and/or sepsis, internal hernia, bleeding, need for blood transfusion, bowel obstruction, organ failure, pulmonary embolus, deep venous thrombosis, wound infection, incisional hernia, skin breakdown, and others entailed on the consent form) and after a compliant diet and exercise program, the patient was scheduled for an elective laparoscopic gastric bypass.  Description of Operation:  Following informed consent, the patient was taken to the operating room and placed on the operating table in the supine position.  He had previously received prophylactic antibiotics and subcutaneous heparin for DVT prophylaxis in the pre-op holding area.  After induction of general endotracheal anesthesia by the anesthesiologist, the patient underwent placement of sequential compression devices and an oro-gastric tube.  A timeout was confirmed by the surgery and anesthesia teams.  The patient was adequately padded at all pressure points and placed on a footboard to prevent slippage from the OR table during extremes of position during surgery.  He underwent a routine sterile prep and drape of her entire abdomen.    Next, A transverse incision was made under the left subcostal area and a 2mm optical viewing trocar was introduced into the peritoneal cavity. Pneumoperitoneum  was applied with a high flow and low pressure. A laparoscope was inserted to confirm placement. A extraperitoneal block was then placed at the lateral abdominal wall using exparel diluted with marcaine . 5 additional trocars were placed: 1 61mm trocar to the left of the midline. 1 additional 39mm trocar in the left lateral area, 1 80mm trocar in the right mid abdomen, and 1 43mm trocar in the right subcostal area.  The greater omentum was flipped over the transverse colon and under the left lobe of the liver. The ligament of trietz was identified. 40cm of jejunum was measured starting from the ligament of Trietz. The mesentery was checked to ensure mobility. Next, a 52mm 2-44mm tristapler was used to divide the jejunum at this location. The harmonic scalpel was used to divide the mesentery down to the origin. A 1/2" penrose was sutured to the distal side. 100cm of jejunum was measured starting at the division. 2-0 silk was used to appose the biliary limb to the 100cm mark of jejunum in 2 places. Enterotomies were made in the biliary and common channels and a 48mm 2-3 tristapler was used to create the J-J anastomosis. A 2-0 silk was used to appose the enterotomy edges and a 2-3 tristapler was used to close the enterotomy. An anti-obstruction 2-0 silk suture was placed. Next, the mesenteric defect was closed with a 2-0 silk in running fashion.The J-J appeared patent and in neutral position.  Next, the omentum was divided using the Harmonic scalpel. The patient was placed in steep Reverse Trendelenberg position. A Nathanson retracted was placed through a subxiphoid incision and used to retract the liver.   Previous endoscopy showed hiatal hernia. Therefore, the pars flaccida was incised with harmonic scalpel. The stomach was reduced  but on dissection of the posterior crus there was a visible hernia with small sac. The sac was dissected free and 1 0 ethibond sutures placed in interrupted fashion. A calibration  tube was passed to ensure appropriate size of the hiatus.  The fat pad over the fundus was incised to free the fundus. Next, a position along the lesser curve 6cm from GE junction was identified. The pars flaccida was entered and the fat over the lesser curve divided to enter the lesser sac. Multiple 58mm 3-7mm tristaple firings were peformed to create a 6cm pouch. The Roux limb was identified using the placed penrose and brought up to the stomach in antecolic fashion. The limb was inspected to ensure a neutral position. A 2-0 vicryl suture was then used to create a posterior layer connecting the stomach to the Roux limb jejunum in running fashion. Next cautery was used to create an enterotomy along the medial aspect of this suture line and Harmonic scalpel used to create gastotomy. A 62mm 3-76mm tristapler was then used to create a 25-26mm anastomosis. 2 2-0 vicryl sutures were used in running fashion to close the gastrotomy. Finally, a 2-0 vicryl suture was used to close an anterior layer of stomach and jejunum over the anastomosis in running fashion. The penrose was removed from the Roux limb. A 2-0 vicryl was used to appose the transverse mesocolon to the mesentery of the Roux limb.  The assistant then went and performed an upper endoscopy and leak test. No bubbles were seen and the pouch and limb distended appropriately. The limb and pouch were deflated, the endoscope was removed. Hemostasis was ensured. Pneumoperitoneum was evacuated, all ports were removed and all incisions closed with 4-0 monocryl suture in subcuticular fashion. Steristrips and bandaids were put in place for dressing. The patient awoke from anesthesia and was brought to pacu in stable condition. All counts were correct.  Specimens:  None  Estimated Blood Loss: 30 ml  Local Anesthesia: 50 ml Exparel: 0.5% Marcaine Mix  Post-Op Plan:       Pain Management: PO, prn      Antibiotics: Prophylactic      Anticoagulation:  Prophylactic, Starting now      Post Op Studies/Consults: Not applicable      Intended Discharge: within 48h      Intended Outpatient Follow-Up: Two Week      Intended Outpatient Studies: Not Applicable      Other: Not Applicable   Joshua Cochran

## 2018-11-20 NOTE — Progress Notes (Signed)
PHARMACY CONSULT FOR:  Risk Assessment for Post-Discharge VTE Following Bariatric Surgery  Post-Discharge VTE Risk Assessment: This patient's probability of 30-day post-discharge VTE is increased due to the factors marked: X  Male  X  Age >/=60 years    BMI >/=50 kg/m2    CHF    Dyspnea at Rest    Paraplegia  X Non-gastric-band surgery    Operation Time >/=3 hr    Return to OR     Length of Stay >/= 3 d      Hx of VTE   Hypercoagulable condition   Significant venous stasis   Predicted probability of 30-day post-discharge VTE: 0.6%  Other patient-specific factors to consider:  none  Recommendation for Discharge: Enoxaparin 40 mg [place] q12h x 2 weeks post-discharge  Joshua Cochran is a 68 y.o. male who underwent  laparoscopic Roux-en-Y gastric bypass 11/20/2018    No Known Allergies  Patient Measurements: Height: 5\' 11"  (180.3 cm) Weight: (!) 305 lb (138.3 kg) IBW/kg (Calculated) : 75.3 Body mass index is 42.54 kg/m.  Recent Labs    11/20/18 1445  HGB 15.7  HCT 46.3   Estimated Creatinine Clearance: 89.8 mL/min (by C-G formula based on SCr of 1.15 mg/dL).    Past Medical History:  Diagnosis Date  . Abdominal panniculus   . Arthritis    bilateral hips  . Essential hypertension, benign 05/10/2016  . GERD (gastroesophageal reflux disease)   . History of carpal tunnel syndrome    Bilateral  . History of hiatal hernia 2018   3 cm hiatal hernia  . Low back pain    Bulging disk  . OSA (obstructive sleep apnea)   . Pneumonia    history of  . PONV (postoperative nausea and vomiting)      Medications Prior to Admission  Medication Sig Dispense Refill Last Dose  . acetaminophen (TYLENOL) 325 MG tablet Take 650 mg by mouth every 6 (six) hours as needed for moderate pain.   Past Week at Unknown time  . atorvastatin (LIPITOR) 10 MG tablet Take 10 mg by mouth daily.   11/20/2018 at 0730  . benazepril (LOTENSIN) 20 MG tablet Take 20 mg by mouth daily.   11/20/2018 at  0730  . cholecalciferol (VITAMIN D) 1000 units tablet Take 2,000 Units by mouth daily.    11/19/2018 at Unknown time  . Omega-3 Fatty Acids (FISH OIL) 1000 MG CAPS Take 1,000 mg by mouth 2 (two) times daily.    11/19/2018 at Unknown time  . pantoprazole (PROTONIX) 40 MG tablet Take 1 tablet (40 mg total) by mouth 2 (two) times daily before a meal. (Patient taking differently: Take 40 mg by mouth daily as needed (heartburn). ) 60 tablet 5 11/20/2018 at [place], Pharm.D [phone] 11/20/2018 3:34 PM

## 2018-11-20 NOTE — Progress Notes (Signed)
Discussed post op day goals with patient including ambulation, IS, diet progression, pain, and nausea control.  BSTOP education provided including BSTOP information guide, "Guide for Pain Management after your Bariatric Procedure".  Questions answered. 

## 2018-11-21 ENCOUNTER — Encounter (HOSPITAL_COMMUNITY): Payer: Self-pay | Admitting: General Surgery

## 2018-11-21 LAB — COMPREHENSIVE METABOLIC PANEL
ALT: 85 U/L — ABNORMAL HIGH (ref 0–44)
AST: 56 U/L — ABNORMAL HIGH (ref 15–41)
Albumin: 3.5 g/dL (ref 3.5–5.0)
Alkaline Phosphatase: 63 U/L (ref 38–126)
Anion gap: 10 (ref 5–15)
BUN: 21 mg/dL (ref 8–23)
CO2: 24 mmol/L (ref 22–32)
Calcium: 8.8 mg/dL — ABNORMAL LOW (ref 8.9–10.3)
Chloride: 101 mmol/L (ref 98–111)
Creatinine, Ser: 1.36 mg/dL — ABNORMAL HIGH (ref 0.61–1.24)
GFR calc Af Amer: >60 mL/min (ref >60)
GFR calc non Af Amer: 54 mL/min — ABNORMAL LOW (ref >60)
Glucose, Bld: 178 mg/dL — ABNORMAL HIGH (ref 70–99)
Potassium: 5 mmol/L (ref 3.5–5.1)
Sodium: 135 mmol/L (ref 135–145)
Total Bilirubin: 0.8 mg/dL (ref 0.3–1.2)
Total Protein: 6.6 g/dL (ref 6.5–8.1)

## 2018-11-21 LAB — CBC WITH DIFFERENTIAL/PLATELET
Abs Immature Granulocytes: 0.06 10*3/uL (ref 0.00–0.07)
Basophils Absolute: 0 10*3/uL (ref 0.0–0.1)
Basophils Relative: 0 %
Eosinophils Absolute: 0 10*3/uL (ref 0.0–0.5)
Eosinophils Relative: 0 %
HCT: 43.1 % (ref 39.0–52.0)
Hemoglobin: 14.5 g/dL (ref 13.0–17.0)
Immature Granulocytes: 0 %
Lymphocytes Relative: 7 %
Lymphs Abs: 1 10*3/uL (ref 0.7–4.0)
MCH: 31.4 pg (ref 26.0–34.0)
MCHC: 33.6 g/dL (ref 30.0–36.0)
MCV: 93.3 fL (ref 80.0–100.0)
Monocytes Absolute: 0.8 10*3/uL (ref 0.1–1.0)
Monocytes Relative: 6 %
Neutro Abs: 12.4 10*3/uL — ABNORMAL HIGH (ref 1.7–7.7)
Neutrophils Relative %: 87 %
Platelets: 221 10*3/uL (ref 150–400)
RBC: 4.62 MIL/uL (ref 4.22–5.81)
RDW: 12.5 % (ref 11.5–15.5)
WBC: 14.3 10*3/uL — ABNORMAL HIGH (ref 4.0–10.5)
nRBC: 0 % (ref 0.0–0.2)

## 2018-11-21 MED ORDER — ACETAMINOPHEN 500 MG PO TABS: 1000.0000 mg | ORAL_TABLET | Freq: Three times a day (TID) | ORAL | 0 refills | Status: AC

## 2018-11-21 MED ORDER — ONDANSETRON 4 MG PO TBDP: 4.0000 mg | ORAL_TABLET | Freq: Four times a day (QID) | ORAL | 0 refills | Status: AC | PRN

## 2018-11-21 MED ORDER — PANTOPRAZOLE SODIUM 40 MG PO TBEC: 40.0000 mg | DELAYED_RELEASE_TABLET | Freq: Every day | ORAL | 0 refills | Status: AC

## 2018-11-21 MED ORDER — ENOXAPARIN SODIUM 40 MG/0.4ML ~~LOC~~ SOLN: 40.0000 mg | SUBCUTANEOUS | 0 refills | Status: AC

## 2018-11-21 MED FILL — ONDANSETRON ODT 4 MG TABLET: 4 | 5 days supply | Qty: 20 | Fill #0

## 2018-11-21 MED FILL — ENOXAPARIN 40 MG/0.4 ML SYR: 40 | 14 days supply | Qty: 6 | Fill #0

## 2018-11-21 NOTE — Progress Notes (Signed)
Patient alert and oriented, Post op day 1.  Provided support and encouragement.  Encouraged pulmonary toilet, ambulation and small sips of liquids.  All questions answered.  Will continue to monitor. 

## 2018-11-21 NOTE — Progress Notes (Signed)
Patient alert and oriented, pain is controlled. Patient is tolerating fluids, advanced to protein shake today, patient is tolerating well. Reviewed Gastric Bypass discharge instructions with patient and patient is able to articulate understanding. Provided information on BELT program, Support Group and [facility] outpatient pharmacy. All questions answered, will continue to monitor.   Total fluid intake 660 Per dehydration protocol call back one week post op 

## 2018-11-21 NOTE — Discharge Instructions (Signed)
Enoxaparin injection What is this medicine? ENOXAPARIN (ee nox a PA rin) is used after knee, hip, or abdominal surgeries to prevent blood clotting. It is also used to treat existing blood clots in the lungs or in the veins. This medicine may be used for other purposes; ask your health care provider or pharmacist if you have questions. COMMON BRAND NAME(S): Lovenox What should I tell my health care provider before I take this medicine? They need to know if you have any of these conditions:  bleeding disorders, hemorrhage, or hemophilia  infection of the heart or heart valves  kidney or liver disease  previous stroke  prosthetic heart valve  recent surgery or delivery of a baby  ulcer in the stomach or intestine, diverticulitis, or other bowel disease  an unusual or allergic reaction to enoxaparin, heparin, pork or pork products, other medicines, foods, dyes, or preservatives  pregnant or trying to get pregnant  breast-feeding How should I use this medicine? This medicine is for injection under the skin. It is usually given by a health-care professional. You or a family member may be trained on how to give the injections. If you are to give yourself injections, make sure you understand how to use the syringe, measure the dose if necessary, and give the injection. To avoid bruising, do not rub the site where this medicine has been injected. Do not take your medicine more often than directed. Do not stop taking except on the advice of your doctor or health care professional. Make sure you receive a puncture-resistant container to dispose of the needles and syringes once you have finished with them. Do not reuse these items. Return the container to your doctor or health care professional for proper disposal. Talk to your pediatrician regarding the use of this medicine in children. Special care may be needed. Overdosage: If you think you have taken too much of this medicine contact a poison  control center or emergency room at once. NOTE: This medicine is only for you. Do not share this medicine with others. What if I miss a dose? If you miss a dose, take it as soon as you can. If it is almost time for your next dose, take only that dose. Do not take double or extra doses. What may interact with this medicine?  aspirin and aspirin-like medicines  certain medicines that treat or prevent blood clots  dipyridamole  NSAIDs, medicines for pain and inflammation, like ibuprofen or naproxen This list may not describe all possible interactions. Give your health care provider a list of all the medicines, herbs, non-prescription drugs, or dietary supplements you use. Also tell them if you smoke, drink alcohol, or use illegal drugs. Some items may interact with your medicine. What should I watch for while using this medicine? Visit your healthcare professional for regular checks on your progress. You may need blood work done while you are taking this medicine. Your condition will be monitored carefully while you are receiving this medicine. It is important not to miss any appointments. If you are going to need surgery or other procedure, tell your healthcare professional that you are using this medicine. Using this medicine for a long time may weaken your bones and increase the risk of bone fractures. Avoid sports and activities that might cause injury while you are using this medicine. Severe falls or injuries can cause unseen bleeding. Be careful when using sharp tools or knives. Consider using an electric razor. Take special care brushing or flossing your   teeth. Report any injuries, bruising, or red spots on the skin to your healthcare professional. Wear a medical ID bracelet or chain. Carry a card that describes your disease and details of your medicine and dosage times. What side effects may I notice from receiving this medicine? Side effects that you should report to your doctor or health  care professional as soon as possible:  allergic reactions like skin rash, itching or hives, swelling of the face, lips, or tongue  bone pain  signs and symptoms of bleeding such as bloody or black, tarry stools; red or dark-brown urine; spitting up blood or brown material that looks like coffee grounds; red spots on the skin; unusual bruising or bleeding from the eye, gums, or nose  signs and symptoms of a blood clot such as chest pain; shortness of breath; pain, swelling, or warmth in the leg  signs and symptoms of a stroke such as changes in vision; confusion; trouble speaking or understanding; severe headaches; sudden numbness or weakness of the face, arm or leg; trouble walking; dizziness; loss of coordination Side effects that usually do not require medical attention (report to your doctor or health care professional if they continue or are bothersome):  hair loss  pain, redness, or irritation at site where injected This list may not describe all possible side effects. Call your doctor for medical advice about side effects. You may report side effects to FDA at 1-800-FDA-1088. Where should I keep my medicine? Keep out of the reach of children. Store at room temperature between 15 and 30 degrees C (59 and 86 degrees F). Do not freeze. If your injections have been specially prepared, you may need to store them in the refrigerator. Ask your pharmacist. Throw away any unused medicine after the expiration date. NOTE: This sheet is a summary. It may not cover all possible information. If you have questions about this medicine, talk to your doctor, pharmacist, or health care provider.  2020 Elsevier/Gold Standard (2017-01-27 11:25:34)     GASTRIC BYPASS/SLEEVE  Home Care Instructions   These instructions are to help you care for yourself when you go home.  Call: If you have any problems. . Call [phone] and ask for the surgeon on call . If you need immediate help, come to the ER  at [facility].  . Tell the ER staff that you are a new post-op gastric bypass or gastric sleeve patient   Signs and symptoms to report: . Severe vomiting or nausea o If you cannot keep down clear liquids for longer than 1 day, call your surgeon  . Abdominal pain that does not get better after taking your pain medication . Fever over 100.4 F with chills . Heart beating over 100 beats a minute . Shortness of breath at rest . Chest pain .  Redness, swelling, drainage, or foul odor at incision (surgical) sites .  If your incisions open or pull apart . Swelling or pain in calf (lower leg) . Diarrhea (Loose bowel movements that happen often), frequent watery, uncontrolled bowel movements . Constipation, (no bowel movements for 3 days) if this happens: Pick one o Milk of Magnesia, 2 tablespoons by mouth, 3 times a day for 2 days if needed o Stop taking Milk of Magnesia once you have a bowel movement o Call your doctor if constipation continues Or o Miralax  (instead of Milk of Magnesia) following the label instructions o Stop taking Miralax once you have a bowel movement o Call your doctor if constipation   continues . Anything you think is not normal   Normal side effects after surgery: . Unable to sleep at night or unable to focus . Irritability or moody . Being tearful (crying) or depressed These are common complaints, possibly related to your anesthesia medications that put you to sleep, stress of surgery, and change in lifestyle.  This usually goes away a few weeks after surgery.  If these feelings continue, call your primary care doctor.   Wound Care: You may have surgical glue, steri-strips, or staples over your incisions after surgery . Surgical glue:  Looks like a clear film over your incisions and will wear off a little at a time . Steri-strips: Strips of tape over your incisions. You may notice a yellowish color on the skin under the steri-strips. This is used to make the    steri-strips stick better. Do not pull the steri-strips off - let them fall off . Staples: Staples may be removed before you leave the hospital o If you go home with staples, call [facility], [phone] at for an appointment with your surgeon's nurse to have staples removed 10 days after surgery. . Showering: You may shower two (2) days after your surgery unless your surgeon tells you differently o Wash gently around incisions with warm soapy water, rinse well, and gently pat dry  o No tub baths until staples are removed, steri-strips fall off or glue is gone.    Medications: . Medications should be liquid or crushed if larger than the size of a dime . Extended release pills (medication that release a little bit at a time through the day) should NOT be crushed or cut. (examples include XL, ER, DR, SR) . Depending on the size and number of medications you take, you may need to space (take a few throughout the day)/change the time you take your medications so that you do not over-fill your pouch (smaller stomach) . Make sure you follow-up with your primary care doctor to make medication changes needed during rapid weight loss and life-style changes . If you have diabetes, follow up with the doctor that orders your diabetes medication(s) within one week after surgery and check your blood sugar regularly. . Do not drive while taking prescription pain medication  . It is ok to take Tylenol by the bottle instructions with your pain medicine or instead of your pain medicine as needed.  DO NOT TAKE NSAIDS (EXAMPLES OF NSAIDS:  IBUPROFREN/ NAPROXEN)  Diet:                    First 2 Weeks  You will see the dietician t about two (2) weeks after your surgery. The dietician will increase the types of foods you can eat if you are handling liquids well: . If you have severe vomiting or nausea and cannot keep down clear liquids lasting longer than 1 day, call your surgeon @ ([phone]) Protein  Shake . Drink at least 2 ounces of shake 5-6 times per day . Each serving of protein shakes (usually 8 - 12 ounces) should have: o 15 grams of protein  o And no more than 5 grams of carbohydrate  . Goal for protein each day: o Men = 80 grams per day o Women = 60 grams per day . Protein powder may be added to fluids such as non-fat milk or Lactaid milk or unsweetened Soy/Almond milk (limit to 35 grams added protein powder per serving)  Hydration . Slowly increase the   amount of water and other clear liquids as tolerated (See Acceptable Fluids) . Slowly increase the amount of protein shake as tolerated  .  Sip fluids slowly and throughout the day.  Do not use straws. . May use sugar substitutes in small amounts (no more than 6 - 8 packets per day; i.e. Splenda)  Fluid Goal . The first goal is to drink at least 8 ounces of protein shake/drink per day (or as directed by the nutritionist); some examples of protein shakes are Syntrax Nectar, Adkins Advantage, EAS Edge HP, and Unjury. See handout from pre-op Bariatric Education Class: o Slowly increase the amount of protein shake you drink as tolerated o You may find it easier to slowly sip shakes throughout the day o It is important to get your proteins in first . Your fluid goal is to drink 64 - 100 ounces of fluid daily o It may take a few weeks to build up to this . 32 oz (or more) should be clear liquids  And  . 32 oz (or more) should be full liquids (see below for examples) . Liquids should not contain sugar, caffeine, or carbonation  Clear Liquids: . Water or Sugar-free flavored water (i.e. Fruit H2O, Propel) . Decaffeinated coffee or tea (sugar-free) . Crystal Lite, Wyler's Lite, Minute Maid Lite . Sugar-free Jell-O . Bouillon or broth . Sugar-free Popsicle:   *Less than 20 calories each; Limit 1 per day  Full Liquids: Protein Shakes/Drinks + 2 choices per day of other full liquids . Full liquids must be: o No More Than 15  grams of Carbs per serving  o No More Than 3 grams of Fat per serving . Strained low-fat cream soup (except Cream of Potato or Tomato) . Non-Fat milk . Fat-free Lactaid Milk . Unsweetened Soy Or Unsweetened Almond Milk . Low Sugar yogurt (Dannon Lite & Fit, Greek yogurt; Oikos Triple Zero; Chobani Simply 100; Yoplait 100 calorie Greek - No Fruit on the Bottom)    Vitamins and Minerals . Start 1 day after surgery unless otherwise directed by your surgeon . 2 Chewable Bariatric Specific Multivitamin / Multimineral Supplement with iron (Example: Bariatric Advantage Multi EA) . Chewable Calcium with Vitamin D-3 (Example: 3 Chewable Calcium Plus 600 with Vitamin D-3) o Take 500 mg three (3) times a day for a total of 1500 mg each day o Do not take all 3 doses of calcium at one time as it may cause constipation, and you can only absorb 500 mg  at a time  o Do not mix multivitamins containing iron with calcium supplements; take 2 hours apart . Menstruating women and those with a history of anemia (a blood disease that causes weakness) may need extra iron o Talk with your doctor to see if you need more iron . Do not stop taking or change any vitamins or minerals until you talk to your dietitian or surgeon . Your Dietitian and/or surgeon must approve all vitamin and mineral supplements   Activity and Exercise: Limit your physical activity as instructed by your doctor.  It is important to continue walking at home.  During this time, use these guidelines: . Do not lift anything greater than ten (10) pounds for at least two (2) weeks . Do not go back to work or drive until your surgeon says you can . You may have sex when you feel comfortable  o It is VERY important for male patients to use a reliable birth control method; fertility often increases after   surgery  o All hormonal birth control will be ineffective for 30 days after surgery due to medications given during surgery a barrier method must be  used. o Do not get pregnant for at least 18 months . Start exercising as soon as your doctor tells you that you can o Make sure your doctor approves any physical activity . Start with a simple walking program . Walk 5-15 minutes each day, 7 days per week.  . Slowly increase until you are walking 30-45 minutes per day Consider joining our BELT program. [phone] or email [email or web address]   Special Instructions Things to remember: . Use your CPAP when sleeping if this applies to you  . [facility] has two free Bariatric Surgery Support Groups that meet monthly o The 3rd Thursday of each month, 6 pm, [facility]  o The 2nd Friday of each month, 11:45 am in the private dining room in the basement of [facility] . It is very important to keep all follow up appointments with your surgeon, dietitian, primary care physician, and behavioral health practitioner . Routine follow up schedule with your surgeon include appointments at 2-3 weeks, 6-8 weeks, 6 months, and 1 year at a minimum.  Your surgeon may request to see you more often.   o After the first year, please follow up with your bariatric surgeon and dietitian at least once a year in order to maintain best weight loss results [facility]: [phone] [facility]: [phone] Bariatric Nurse Coordinator: [phone]      Reviewed and Endorsed  by [facility] Patient Education Committee, June, 2016 Edits Approved: Aug, 2018    

## 2018-11-21 NOTE — Discharge Summary (Signed)
Physician Discharge Summary  Joshua Cochran:[id number] DOB: 06-25-1950 DOA: 11/20/2018  PCP: Neale Burly, MD  Admit date: 11/20/2018 Discharge date: 11/21/2018  Recommendations for Outpatient Follow-up:  1. (include homehealth, outpatient follow-up instructions, specific recommendations for PCP to follow-up on, etc.)  Follow-up Information    Daizy Outen, Arta Bruce, MD. Go on 12/13/2018.   Specialty: General Surgery Why: at [facility] information: [place] [address] [phone]        Vilas Edgerly, Arta Bruce, MD .   Specialty: General Surgery Contact information: [place] [address] [phone]          Discharge Diagnoses:  Active Problems:   Obesity   Surgical Procedure: Roux-en-Y gastric bypass, upper endoscopy  Discharge Condition: Good Disposition: Home  Diet recommendation: Postoperative sleeve gastrectomy diet (liquids only)  Filed Weights   11/20/18 0945  Weight: (!) 138.3 kg     Hospital Course:  The patient was admitted after undergoing Roux-en-Y gastric bypass. POD 0 he ambulated well. POD 1 he was started on the water diet protocol and tolerated 420 ml in the first shift. Once meeting the water amount he was advanced to bariatric protein shakes which they tolerated and were discharged home POD 1.  Treatments: surgery: Roux-en-Y gastric bypass  Discharge Instructions  Discharge Instructions    Ambulate hourly while awake   Complete by: As directed    Call MD for:  difficulty breathing, headache or visual disturbances   Complete by: As directed    Call MD for:  persistant dizziness or light-headedness   Complete by: As directed    Call MD for:  persistant nausea and vomiting   Complete by: As directed    Call MD for:  redness, tenderness, or signs of infection (pain, swelling, redness, odor or green/yellow discharge around incision site)   Complete by: As directed    Call MD  for:  severe uncontrolled pain   Complete by: As directed    Call MD for:  temperature >101 F   Complete by: As directed    Diet bariatric full liquid   Complete by: As directed    Discharge wound care:   Complete by: As directed    Remove Bandaids tomorrow, ok to shower tomorrow. Steristrips may fall off in 1-3 weeks.   Incentive spirometry   Complete by: As directed    Perform hourly while awake     Allergies as of 11/21/2018   No Known Allergies     Medication List    STOP taking these medications   benazepril 20 MG tablet Commonly known as: LOTENSIN     TAKE these medications   acetaminophen 325 MG tablet Commonly known as: TYLENOL Take 650 mg by mouth every 6 (six) hours as needed for moderate pain. What changed: Another medication with the same name was added. Make sure you understand how and when to take each.   acetaminophen 500 MG tablet Commonly known as: TYLENOL Take 2 tablets (1,000 mg total) by mouth every 8 (eight) hours for 5 days. What changed: You were already taking a medication with the same name, and this prescription was added. Make sure you understand how and when to take each.   atorvastatin 10 MG tablet Commonly known as: LIPITOR Take 10 mg by mouth daily.   cholecalciferol 1000 units tablet Commonly known as: VITAMIN D Take 2,000 Units by mouth daily.   enoxaparin 40 MG/0.4ML injection Commonly known as:  LOVENOX Inject 0.4 mLs (40 mg total) into the skin daily for 14 doses.   Fish Oil 1000 MG Caps Take 1,000 mg by mouth 2 (two) times daily.   ondansetron 4 MG disintegrating tablet Commonly known as: ZOFRAN-ODT Take 1 tablet (4 mg total) by mouth every 6 (six) hours as needed for nausea or vomiting.   pantoprazole 40 MG tablet Commonly known as: Protonix Take 1 tablet (40 mg total) by mouth 2 (two) times daily before a meal. What changed:   when to take this  reasons to take this            Discharge Care Instructions  (From  admission, onward)         Start     Ordered   11/21/18 0000  Discharge wound care:    Comments: Remove Bandaids tomorrow, ok to shower tomorrow. Steristrips may fall off in 1-3 weeks.   11/21/18 1002         Follow-up Information    Achillies Buehl, De Blanch, MD. Go on 12/13/2018.   Specialty: General Surgery Why: at [facility] Contact information: [address] [place] [address] [phone]        Edwina Grossberg, De Blanch, MD .   Specialty: General Surgery Contact information: [address] [place] [address] [place] [address] [phone]            The results of significant diagnostics from this hospitalization (including imaging, microbiology, ancillary and laboratory) are listed below for reference.    Significant Diagnostic Studies: No results found.  Labs: Basic Metabolic Panel: Recent Labs  Lab 11/16/18 1118 11/21/18 0404  NA 137 135  K 4.7 5.0  CL 105 101  CO2 23 24  GLUCOSE 112* 178*  BUN 25* 21  CREATININE 1.15 1.36*  CALCIUM 9.2 8.8*   Liver Function Tests: Recent Labs  Lab 11/16/18 1118 11/21/18 0404  AST 23 56*  ALT 32 85*  ALKPHOS 77 63  BILITOT 1.1 0.8  PROT 7.4 6.6  ALBUMIN 4.2 3.5    CBC: Recent Labs  Lab 11/16/18 1118 11/20/18 1445 11/21/18 0404  WBC 7.3  --  14.3*  NEUTROABS 4.2  --  12.4*  HGB 14.7 15.7 14.5  HCT 43.8 46.3 43.1  MCV 94.4  --  93.3  PLT 226  --  221    CBG: No results for input(s): GLUCAP in the last 168 hours.  Active Problems:   Obesity   VTE plan: I will prescribe outpatient chemical prophylaxis of enoxaparin due to this increased risk ([email or web address])  Time coordinating discharge: 

## 2018-11-21 NOTE — Progress Notes (Signed)
Progress Note: General Surgery Service   Chief Complaint/Subjective: Joshua Cochran reports that "things have been going well" overnight.  He reports that he had some epigastric pain yesterday and eructation, but denies nausea, vomiting, chest pain or shortness of breath.  He reports that his pain has improved this morning.  He has been tolerating ensure, and reports that he has walked a lap around the unit.  He has not passed flatus or had a bowel movement following surgery.  Objective: Vital signs in last 24 hours: Temp:  [97.7 F (36.5 C)-98.3 F (36.8 C)] 97.8 F (36.6 C) (10/06 0454) Pulse Rate:  [72-109] 72 (10/06 0454) Resp:  [11-20] 20 (10/06 0454) BP: (110-157)/(67-94) 120/67 (10/06 0454) SpO2:  [96 %-100 %] 99 % (10/06 0454) Weight:  [138.3 kg] 138.3 kg (10/05 0945) Last BM Date: 11/20/18  Intake/Output from previous day: 10/05 0701 - 10/06 0700 In: 3425.4 [P.O.:420; I.V.:2955.4; IV Piggyback:50] Out: 1270 [Urine:1250; Blood:20] Intake/Output this shift: No intake/output data recorded.  Lungs: Clear to auscultation bilaterally  Cardiovascular: Normal cardiac rate and rhythm, no m/r/g  Abd: Soft, normoactive bowel sounds in all 4 quadrants, no pain to abdominal palpation.  Bandages clean, dry and intact.  Extremities: No LE edema  Neuro: Alert and oriented x 3  Lab Results: CBC  Recent Labs    11/20/18 1445 11/21/18 0404  WBC  --  14.3*  HGB 15.7 14.5  HCT 46.3 43.1  PLT  --  221   BMET Recent Labs    11/21/18 0404  NA 135  K 5.0  CL 101  CO2 24  GLUCOSE 178*  BUN 21  CREATININE 1.36*  CALCIUM 8.8*   PT/INR No results for input(s): LABPROT, INR in the last 72 hours. ABG No results for input(s): PHART, HCO3 in the last 72 hours.  Invalid input(s): PCO2, PO2  Studies/Results:  Anti-infectives: Anti-infectives (From admission, onward)   Start     Dose/Rate Route Frequency Ordered Stop   11/20/18 0945  cefoTEtan (CEFOTAN) 2 g in sodium chloride  0.9 % 100 mL IVPB     2 g 200 mL/hr over 30 Minutes Intravenous On call to O.R. 11/20/18 0935 11/20/18 1149      Medications: Scheduled Meds: . acetaminophen  1,000 mg Oral Q8H   Or  . acetaminophen (TYLENOL) oral liquid 160 mg/5 mL  1,000 mg Oral Q8H  . benazepril  20 mg Oral Daily  . enoxaparin (LOVENOX) injection  30 mg Subcutaneous Q12H  . enoxaparin   Does not apply Once  . gabapentin  200 mg Oral Q12H  . Ensure Max Protein  2 oz Oral Q2H   Continuous Infusions: . dextrose 5 % and 0.45% NaCl 100 mL/hr at 11/21/18 0459  . famotidine (PEPCID) IV 20 mg (11/20/18 1705)   PRN Meds:.morphine injection, ondansetron (ZOFRAN) IV, oxyCODONE, simethicone  Assessment/Plan: Patient Active Problem List   Diagnosis Date Noted  . Obesity 11/20/2018  . Essential hypertension, benign 05/10/2016  . Gastroesophageal reflux disease without esophagitis 05/10/2016   s/p Procedure(s): LAPAROSCOPIC ROUX-EN-Y GASTRIC BYPASS AND HIATAL HERNIA REPAIR WITH UPPER ENDOSCOPY, [facility] 11/20/2018  Joshua Cochran is a 68 yo male with a history of hypertension and obstructive sleep apnea who is POD 1 following Roux- EN- Y gastric bypass with hiatal hernia repair.  He reports some epigastric pain and belching yesterday, but is tolerating ensure and ambulating around unit.   VTE: Lovenox 30 mg Q 12hrs, patient encouraged to continue to ambulate around unit  Diet:  Continue protein rich diet following procedure  Pain: well controlled with oxycodone, acetaminophen, and gabapentin.    As long as patient continues to progress well, plan for discharge to home with wife later today or tomorrow.     LOS: 1 day   Delene Ruffini Pg# [phone] [facility]

## 2018-11-23 ENCOUNTER — Encounter: Payer: Self-pay | Admitting: *Deleted

## 2018-11-23 ENCOUNTER — Other Ambulatory Visit: Payer: Self-pay | Admitting: *Deleted

## 2018-11-23 NOTE — Patient Outreach (Signed)
[facility]) Care Management  11/23/2018  Joshua Cochran [birth year] [id number]  Transition of care call/case closure   Referral received: 11/16/18 Initial outreach: 11/23/18 Insurance: [employer] Health Save Plan   Subjective: Initial successful telephone call to patient's mobile number in order to complete transition of care assessment; 2 HIPAA identifiers verified. Explained purpose of call and completed transition of care assessment.  Joshua Cochran states he is doing well, denies post-operative problems, says surgical incisions are unremarkable, states surgical pain well managed with over the counter and prescribed medications, tolerating bariatric liquid diet, denies bladder problems, states he purchase Miralax today as he has not had a bowel movement since surgery. His wife, Joshua Cochran , a [place] RN is assisting with his recovery.  He denies any ongoing health issues, other than his obesity,  and says he does not need a referral to one of the [place] chronic disease management programs.  He says he uses a Ambulance person.  He denies educational needs related to staying safe during the COVID 19 pandemic.    Objective:  Joshua Cochran was hospitalized at [facility] from 10/5-10/07/2018 for laparoscopic Roux en Y gastric bypass and upper endoscopy and hiatal hernia repair on 11/20/18 to treat morbid obesity.  Comorbidities include: HTN, OSA, GERD, low back pain He was discharged to home on 11/21/18 without the need for home health services or DME.   Assessment:  Patient voices good understanding of all discharge instructions except he was injecting Lovenox twice daily instead of once daily as prescribed.   See transition of care flowsheet for assessment details.   Plan:  Verified Lovenox frequency with Dr Kissinger's office and instructed patient on correct frequency.  Reviewed hospital discharge diagnosis oflaparoscopic Roux en Y gastric bypass and upper  endoscopy and hiatal hernia repair and discharge treatment plan using hospital discharge instructions, assessing medication adherence, reviewing problems requiring provider notification, and discussing the importance of follow up with surgeon and registered dietician as directed. No ongoing care management needs identified so will close case to [place] Management services and route successful outreach letter with [place] Management pamphlet and 24 Hour Nurse Line Magnet to [place] [facility] clinical pool to be mailed to patient's home address.   Joshua Ellison RN,CCM,CDE [place] Management Coordinator Office Phone [phone] Office Fax [phone]

## 2018-11-27 ENCOUNTER — Telehealth (HOSPITAL_COMMUNITY): Payer: Self-pay

## 2018-11-27 NOTE — Telephone Encounter (Signed)
Patient called to discuss post bariatric surgery follow up questions.  See below:   1.  Tell me about your pain and pain management?denies  2.  Let's talk about fluid intake.  How much total fluid are you taking in?64 ounces of fluid total  3.  How much protein have you taken in the last 2 days?70-80 grams per day  4.  Have you had nausea?  Tell me about when have experienced nausea and what you did to help?not nauseated   5.  Has the frequency or color changed with your urine?urinating regularly  6.  Tell me what your incisions look like?no problems  7.  Have you been passing gas? BM?had bm took miralax  8.  If a problem or question were to arise who would you call?  Do you know contact numbers for [facility], [facility], and [facility]?aware of how to contact all services  9.  How has the walking going?walking regularly  10.  How are your vitamins and calcium going?  How are you taking them?mvi and 3 calcium without  problems  Seeing PCP next Tuesday for follow up, Lovenox shots taking regularly

## 2018-12-05 ENCOUNTER — Encounter: Payer: 59 | Attending: General Surgery | Admitting: Skilled Nursing Facility1

## 2018-12-05 ENCOUNTER — Other Ambulatory Visit: Payer: Self-pay

## 2018-12-05 DIAGNOSIS — Z6839 Body mass index (BMI) 39.0-39.9, adult: Secondary | ICD-10-CM | POA: Diagnosis not present

## 2018-12-05 DIAGNOSIS — G4730 Sleep apnea, unspecified: Secondary | ICD-10-CM | POA: Diagnosis not present

## 2018-12-05 DIAGNOSIS — M545 Low back pain: Secondary | ICD-10-CM | POA: Diagnosis not present

## 2018-12-05 DIAGNOSIS — E669 Obesity, unspecified: Secondary | ICD-10-CM | POA: Diagnosis not present

## 2018-12-05 DIAGNOSIS — K219 Gastro-esophageal reflux disease without esophagitis: Secondary | ICD-10-CM | POA: Diagnosis not present

## 2018-12-05 DIAGNOSIS — Z6841 Body Mass Index (BMI) 40.0 and over, adult: Secondary | ICD-10-CM

## 2018-12-05 DIAGNOSIS — I10 Essential (primary) hypertension: Secondary | ICD-10-CM | POA: Diagnosis not present

## 2018-12-05 MED FILL — ATORVASTATIN 10 MG TABLET: 10 | 90 days supply | Qty: 90 | Fill #0

## 2018-12-05 MED FILL — LISINOPRIL 2.5 MG TABLET: 2.5 | 90 days supply | Qty: 90 | Fill #0

## 2018-12-06 NOTE — Progress Notes (Signed)
2 Week Post-Operative Nutrition Class   Patient was seen on 04/11/18 for Post-Operative Nutrition education at [facility].    Surgery date: 11/20/18 Surgery type: RYGB Start weight at [facility]: 310.5 Weight today: 291   Body Composition Scale 12/05/18  Total Body Fat % 36.4  Visceral Fat 31  Fat-Free Mass % 63.5   Total Body Water % 44.5   Muscle-Mass lbs 54.9  Body Fat Displacement          Torso  lbs 65.8         Left Leg  lbs 13.1         Right Leg  lbs 13.1         Left Arm  lbs 6.5         Right Arm   lbs 6.5     The following the learning objectives were met by the patient during this course:  Identifies Phase 3 (Soft, High Proteins) Dietary Goals and will begin from 2 weeks post-operatively to 2 months post-operatively  Identifies appropriate sources of fluids and proteins   States protein recommendations and appropriate sources post-operatively  Identifies the need for appropriate texture modifications, mastication, and bite sizes when consuming solids  Identifies appropriate multivitamin and calcium sources post-operatively  Describes the need for physical activity post-operatively and will follow MD recommendations  States when to call healthcare provider regarding medication questions or post-operative complications   Handouts given during class include:  Phase 3A: Soft, High Protein Diet Handout   Follow-Up Plan: Patient will follow-up at [facility] in 6 weeks for 2 month post-op nutrition visit for diet advancement per MD.

## 2018-12-11 ENCOUNTER — Telehealth: Payer: Self-pay | Admitting: Skilled Nursing Facility1

## 2018-12-11 NOTE — Telephone Encounter (Signed)
RD called pt to verify fluid intake once starting soft, solid proteins 2 week post-bariatric surgery.   Daily Fluid intake: 64+ Daily Protein intake: 80g  Concerns/issues:   None reported

## 2019-01-16 ENCOUNTER — Encounter: Payer: Self-pay | Admitting: Dietician

## 2019-01-16 ENCOUNTER — Encounter: Payer: 59 | Attending: General Surgery | Admitting: Dietician

## 2019-01-16 ENCOUNTER — Other Ambulatory Visit: Payer: Self-pay

## 2019-01-16 DIAGNOSIS — E669 Obesity, unspecified: Secondary | ICD-10-CM | POA: Insufficient documentation

## 2019-01-16 NOTE — Patient Instructions (Signed)

## 2019-01-16 NOTE — Progress Notes (Signed)
Bariatric Nutrition Follow-Up Visit Medical Nutrition Therapy  Appt Start Time: 2:00pm   End Time: 2:30pm  2 Months Post-Operative RYGB Surgery Surgery Date: 11/20/2018  Pt's Expectations of Surgery/ Goals: to control blood pressure and lose weight and "look better"   NUTRITION ASSESSMENT  Anthropometrics  Start weight at [facility]: 310.5 lbs (date: 12/06/2017) Today's weight: 273.6 lbs Weight change: -17.4 lbs (since previous nutrition visit)  Body Composition Scale 12/05/2018 01/16/2019  Weight  lbs 291 273.6  BMI 40.6 37.6  Total Body Fat  % 36.4 34.3     Visceral Fat 31 28  Fat-Free Mass  % 63.5 65.6     Total Body Water  % 44.5 46.6     Muscle-Mass  lbs 54.9 51.6  Body Fat Displacement --- ---         Torso  lbs 65.8 58.3         Left Leg  lbs 13.1 11.6         Right Leg  lbs 13.1 11.6         Left Arm  lbs 6.5 5.8         Right Arm  lbs 6.5 5.8     Lifestyle & Dietary Hx Eggs, cheese, low fat cottage cheese, fish, [place] yogurt, Premier Protein shakes,    Estimated daily fluid intake: 64 oz Estimated daily protein intake: 80 g Supplements: bariatric MVI, calcium  Current average weekly physical activity: walking     Post-Op Goals/ Signs/ Symptoms Using straws: no Drinking while eating: no Chewing/swallowing difficulties: no Changes in vision: no Changes to mood/headaches: no Hair loss/changes to skin/nails: no Difficulty focusing/concentrating: no Sweating: no Dizziness/lightheadedness: no Palpitations: no  Carbonated/caffeinated beverages: no N/V/D/C/Gas: constipation (better)  Abdominal pain: no Dumping syndrome: no   NUTRITION DIAGNOSIS  Overweight/obesity ([place]-3.3) related to past poor dietary habits and physical inactivity as evidenced by completed bariatric surgery and following dietary guidelines for continued weight loss and healthy nutrition status.   NUTRITION INTERVENTION Nutrition counseling (C-1) and education (E-2) to facilitate bariatric  surgery goals, including: . Diet advancement to the next phase (phase 4) now including non-starchy vegetables  . The importance of consuming adequate calories as well as certain nutrients daily due to the body's need for essential vitamins, minerals, and fats . The importance of daily physical activity and to reach a goal of at least 150 minutes of moderate to vigorous physical activity weekly (or as directed by their physician) due to benefits such as increased musculature and improved lab values  Handouts Provided Include   Phase 4: Protein + Non-Starchy Vegetables   Bariatric Snack Ideas  Learning Style & Readiness for Change Teaching method utilized: Visual & Auditory  Demonstrated degree of understanding via: Teach Back  Barriers to learning/adherence to lifestyle change: None Identified    MONITORING & EVALUATION Dietary intake, weekly physical activity, body weight, and goals in 4 months.  Next Steps Patient is to follow-up in 4 months for 6 month post-op follow-up.

## 2019-01-17 DIAGNOSIS — K912 Postsurgical malabsorption, not elsewhere classified: Secondary | ICD-10-CM | POA: Diagnosis not present

## 2019-01-17 DIAGNOSIS — Z9884 Bariatric surgery status: Secondary | ICD-10-CM | POA: Diagnosis not present

## 2019-01-17 DIAGNOSIS — E669 Obesity, unspecified: Secondary | ICD-10-CM | POA: Diagnosis not present

## 2019-01-17 DIAGNOSIS — R69 Illness, unspecified: Secondary | ICD-10-CM | POA: Diagnosis not present

## 2019-03-27 MED FILL — ATORVASTATIN 10 MG TABLET: 10 | 90 days supply | Qty: 90 | Fill #0

## 2019-04-28 ENCOUNTER — Ambulatory Visit: Payer: Medicare Other | Attending: Internal Medicine

## 2019-04-28 DIAGNOSIS — Z23 Encounter for immunization: Secondary | ICD-10-CM

## 2019-04-28 NOTE — Progress Notes (Signed)
   Covid-19 Vaccination Clinic  Name:  Joshua Cochran    MRN: [id number] DOB: 1950/04/30  04/28/2019  Joshua Cochran was observed post Covid-19 immunization for 15 minutes without incident. He was provided with Vaccine Information Sheet and instruction to access the V-Safe system.   Joshua Cochran was instructed to call 911 with any severe reactions post vaccine: Marland Kitchen Difficulty breathing  . Swelling of face and throat  . A fast heartbeat  . A bad rash all over body  . Dizziness and weakness   Immunizations Administered    Name Date Dose VIS Date Route   Pfizer COVID-19 Vaccine 04/28/2019  9:27 AM 0.3 mL 01/26/2019 Intramuscular   Manufacturer: [employer], [employer]   Lot: [id number]   NDC: [address]-9

## 2019-05-28 ENCOUNTER — Ambulatory Visit: Payer: Medicare Other | Attending: Internal Medicine

## 2019-05-28 DIAGNOSIS — Z23 Encounter for immunization: Secondary | ICD-10-CM

## 2019-05-28 NOTE — Progress Notes (Signed)
   Covid-19 Vaccination Clinic  Name:  Joshua Cochran    MRN: [id number] DOB: 1950-12-19  05/28/2019  [name] was observed post Covid-19 immunization for 15 minutes without incident. He was provided with Vaccine Information Sheet and instruction to access the V-Safe system.   [name] was instructed to call 911 with any severe reactions post vaccine: Marland Kitchen Difficulty breathing  . Swelling of face and throat  . A fast heartbeat  . A bad rash all over body  . Dizziness and weakness   Immunizations Administered    Name Date Dose VIS Date Route   Pfizer COVID-19 Vaccine 05/28/2019  9:05 AM 0.3 mL 01/26/2019 Intramuscular   Manufacturer: [employer], [employer]   Lot: [id number]   NDC: [address]-2

## 2019-05-29 ENCOUNTER — Encounter: Payer: Medicare Other | Attending: General Surgery | Admitting: Skilled Nursing Facility1

## 2019-05-29 ENCOUNTER — Other Ambulatory Visit: Payer: Self-pay

## 2019-05-29 DIAGNOSIS — K912 Postsurgical malabsorption, not elsewhere classified: Secondary | ICD-10-CM | POA: Diagnosis not present

## 2019-05-29 DIAGNOSIS — R69 Illness, unspecified: Secondary | ICD-10-CM | POA: Diagnosis not present

## 2019-05-29 DIAGNOSIS — Z713 Dietary counseling and surveillance: Secondary | ICD-10-CM | POA: Diagnosis not present

## 2019-05-29 DIAGNOSIS — Z9884 Bariatric surgery status: Secondary | ICD-10-CM | POA: Insufficient documentation

## 2019-05-29 DIAGNOSIS — E669 Obesity, unspecified: Secondary | ICD-10-CM | POA: Diagnosis present

## 2019-05-30 NOTE — Progress Notes (Signed)
Follow-up visit:  Post-Operative RYGB Surgery  Medical Nutrition Therapy:  Appt start time: 6:00pm end time:  7:00pm  Primary concerns today: Post-operative Bariatric Surgery Nutrition Management 6 Month Post-Op Class  Anthropometrics  Start weight at [facility]: 310.5 lbs (date: 12/06/2017) Today's weight: 273.6 lbs  Pt is moving to [place].   Body Composition Scale 05/30/2019  Weight  lbs 262.2  Total Body Fat  % 33     Visceral Fat 25  Fat-Free Mass  % 66.9     Total Body Water  % 47.9     Muscle-Mass  lbs 49.4  BMI 36  Body Fat Displacement ---        Torso  lbs 53.6        Left Leg  lbs 10.7        Right Leg  lbs 10.7        Left Arm  lbs 5.3        Right Arm  lbs 5.3     Information Reviewed/ Discussed During Appointment: -Review of composition scale numbers -Fluid requirements (64-100 ounces) -Protein requirements (60-80g) -Strategies for tolerating diet -Advancement of diet to include Starchy vegetables -Barriers to inclusion of new foods -Inclusion of appropriate multivitamin and calcium supplements  -Exercise recommendations   Fluid intake: adequate   Medications: See List Supplementation: appropriate   Using straws: no Drinking while eating: no Having you been chewing well: yes Chewing/swallowing difficulties: no Changes in vision: no Changes to mood/headaches: no Hair loss/C[place]nges to skin/Changes to nails: no Any difficulty focusing or concentrating: no Sweating: no Dizziness/Lightheaded: no Palpitations: no  Carbonated beverages: no N/V/D/C/GAS: no Abdominal Pain: no Dumping syndrome: no  Recent physical activity:  ADL's  Progress Towards Goal(s):  In Progress  Handouts given during visit include:  Phase V diet Progression   Goals Sheet  The Benefits of Exercise are endless.....  Support Group Topics  Pt Chosen Goals:  I will drink 32 ounces of plain water 7 days a week by (specific date) ______07/13/2021__________ _____ I will eat in  the/at the ______________ every time I eat a meal by (specific date) _______________ I will not drink or sip a beverage with 3 of my meals 7 days a week by (specific date) ________________ Mental Health and Well Being: I will say 2 nice things to and about myself 7 days a week by (specific date) __07/13/2021____________  Teaching Method Utilized:  Visual Auditory Hands on  Demonstrated degree of understanding via:  Teach Back   Monitoring/Evaluation:  Dietary intake, exercise, and body weight. Follow up in 3 months for 9 month post-op visit.

## 2019-07-02 MED FILL — ATORVASTATIN 10 MG TABLET: 10 | 30 days supply | Qty: 30 | Fill #0

## 2019-07-30 DIAGNOSIS — M545 Low back pain: Secondary | ICD-10-CM | POA: Diagnosis not present

## 2019-07-30 DIAGNOSIS — K2100 Gastro-esophageal reflux disease with esophagitis, without bleeding: Secondary | ICD-10-CM | POA: Diagnosis not present

## 2019-07-30 DIAGNOSIS — I10 Essential (primary) hypertension: Secondary | ICD-10-CM | POA: Diagnosis not present

## 2019-07-30 DIAGNOSIS — G4730 Sleep apnea, unspecified: Secondary | ICD-10-CM | POA: Diagnosis not present

## 2019-07-30 MED FILL — ATORVASTATIN CALCIUM 10 MG: 10 | 90 days supply | Qty: 90 | Fill #0

## 2019-08-27 ENCOUNTER — Ambulatory Visit: Payer: Medicare Other | Admitting: Skilled Nursing Facility1

## 2019-11-05 MED FILL — ATORVASTATIN CALCIUM 10 MG: 10 | 90 days supply | Qty: 90 | Fill #0

## 2019-11-30 IMAGING — CR DG CHEST 2V
2 series · 2 of 2 positions shown · non-contrast
Comparison: None.

CLINICAL DATA: Preoperative bariatric surgery for morbid obesity.
Hypertension.

EXAM:
CHEST - 2 VIEW

[w chest pa]
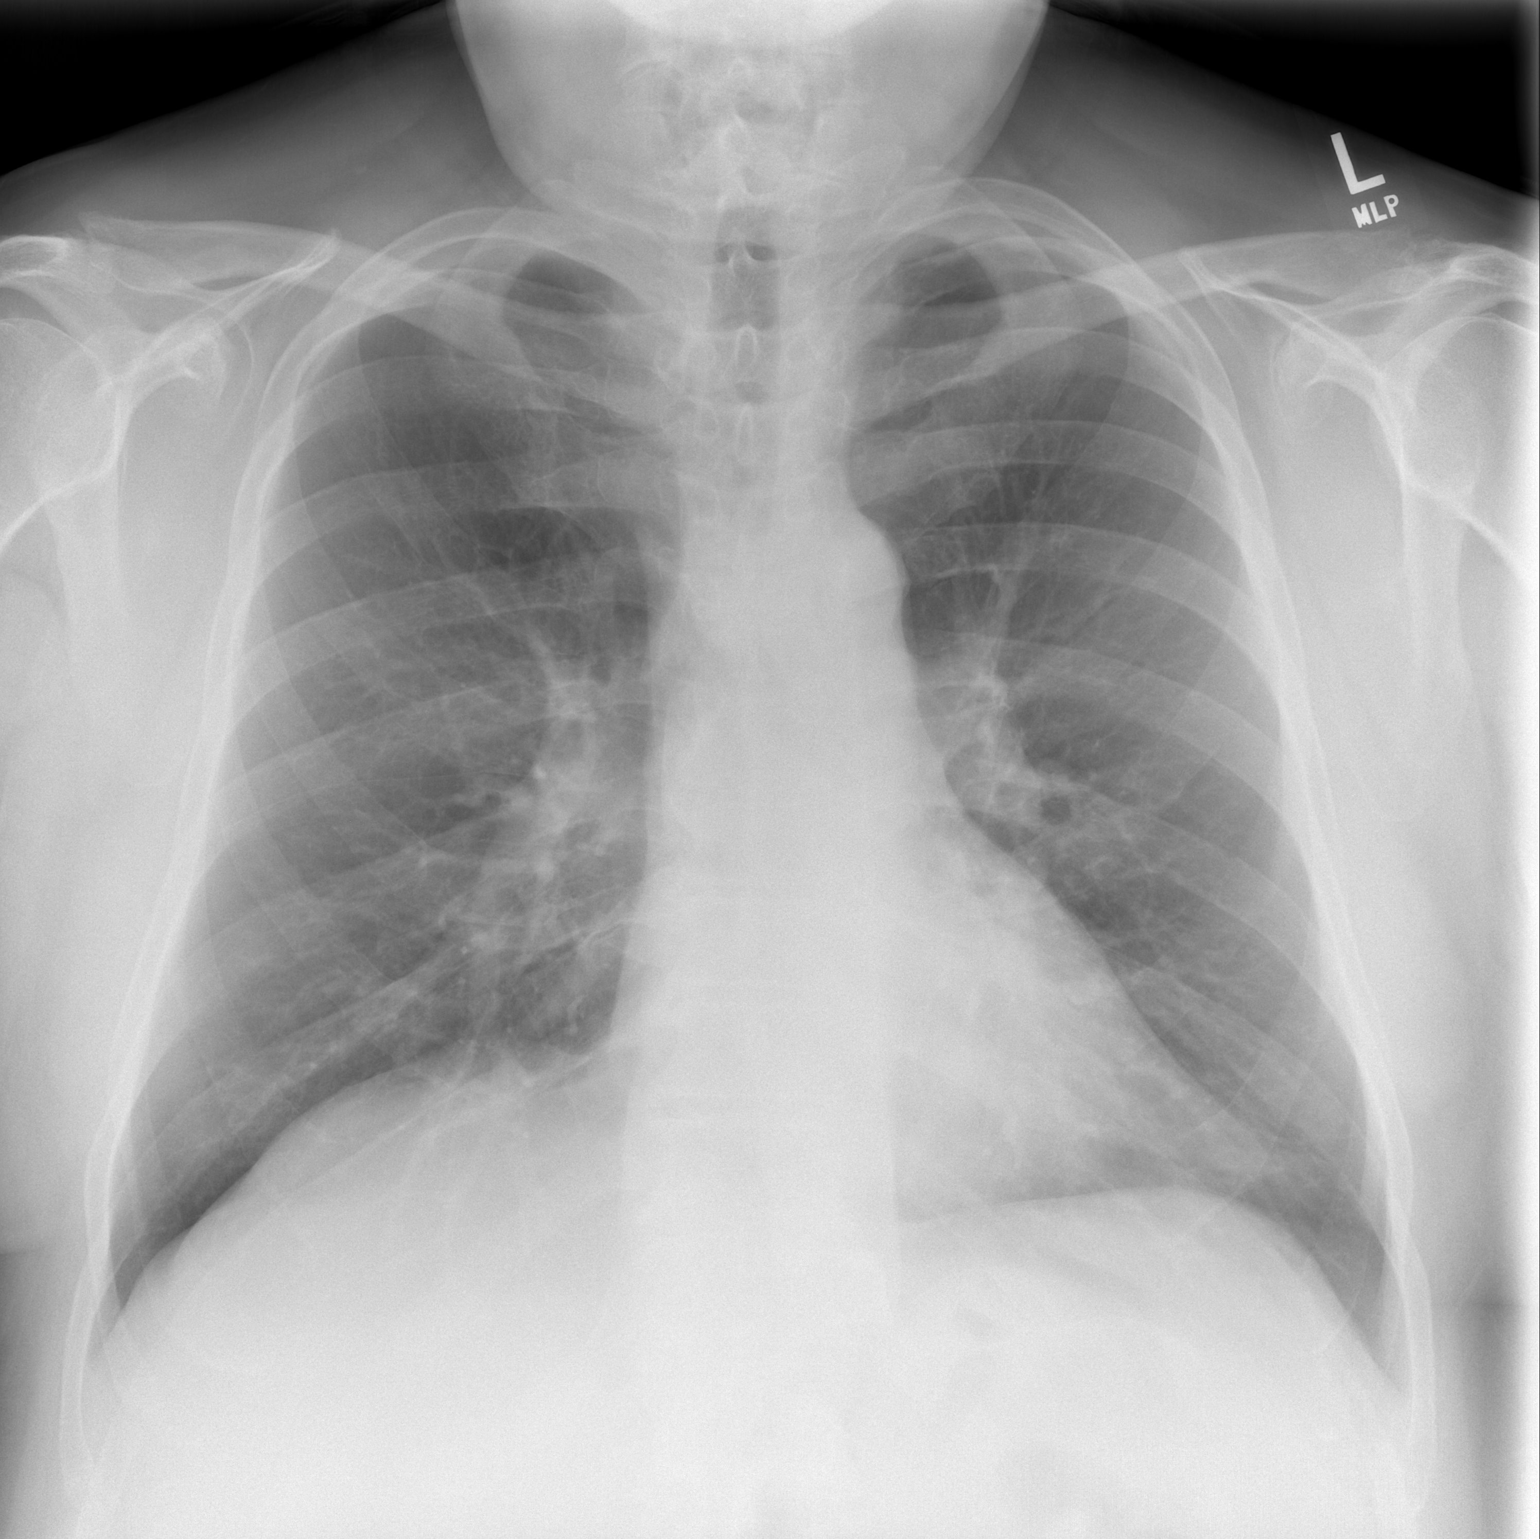

[w chest lat]
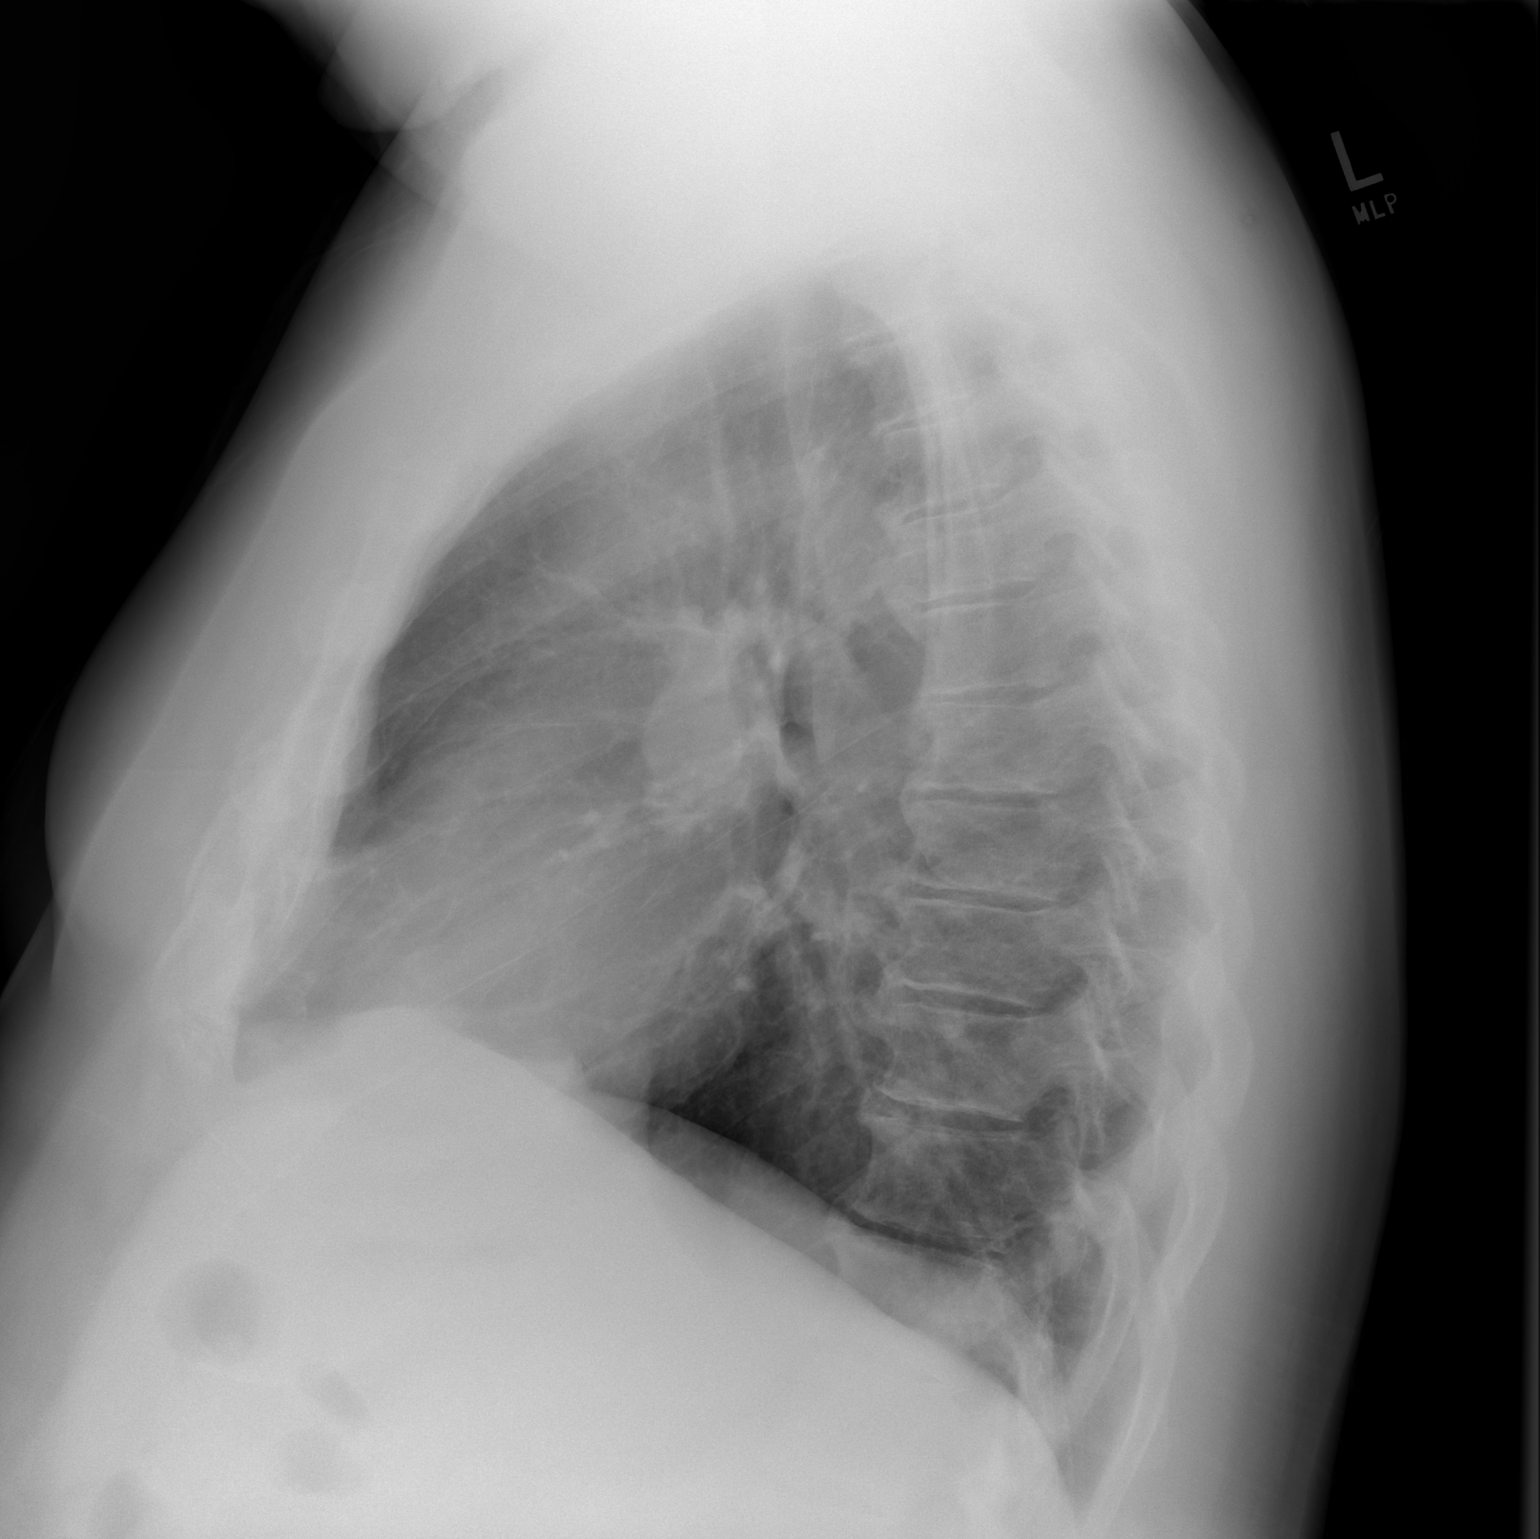

[2 of 2 positions shown; findings below may reference images not displayed]

FINDINGS: Lungs are clear. Heart size and pulmonary vascularity are normal. No
adenopathy. There is degenerative change in the thoracic spine.
IMPRESSION: No edema or consolidation.

## 2020-04-21 ENCOUNTER — Other Ambulatory Visit (HOSPITAL_COMMUNITY): Payer: Self-pay | Admitting: Internal Medicine

## 2020-04-21 DIAGNOSIS — K2100 Gastro-esophageal reflux disease with esophagitis, without bleeding: Secondary | ICD-10-CM | POA: Diagnosis not present

## 2020-04-21 DIAGNOSIS — M545 Low back pain, unspecified: Secondary | ICD-10-CM | POA: Diagnosis not present

## 2020-04-21 DIAGNOSIS — I10 Essential (primary) hypertension: Secondary | ICD-10-CM | POA: Diagnosis not present

## 2020-04-21 DIAGNOSIS — G4730 Sleep apnea, unspecified: Secondary | ICD-10-CM | POA: Diagnosis not present

## 2020-04-21 MED FILL — ATORVASTATIN CALCIUM 10 MG: 10 | 90 days supply | Qty: 90 | Fill #0

## 2020-04-22 MED FILL — TADALAFIL 5 MG TABS: 5 | 90 days supply | Qty: 90 | Fill #0

## 2020-06-04 ENCOUNTER — Encounter (HOSPITAL_COMMUNITY): Payer: Self-pay | Admitting: *Deleted
